# Patient Record
Sex: Female | Born: 1970 | Race: White | Hispanic: No | Marital: Married | Smoking: Former smoker
Health system: Southern US, Community
[De-identification: ages and names within clinical notes are randomized; demographics above are authoritative.]

## PROBLEM LIST (undated history)

## (undated) DIAGNOSIS — I10 Essential (primary) hypertension: Secondary | ICD-10-CM

## (undated) DIAGNOSIS — E0500 Thyrotoxicosis with diffuse goiter without thyrotoxic crisis or storm: Secondary | ICD-10-CM

## (undated) DIAGNOSIS — F419 Anxiety disorder, unspecified: Secondary | ICD-10-CM

## (undated) DIAGNOSIS — E119 Type 2 diabetes mellitus without complications: Secondary | ICD-10-CM

## (undated) DIAGNOSIS — K219 Gastro-esophageal reflux disease without esophagitis: Secondary | ICD-10-CM

---

## 2002-04-20 DIAGNOSIS — I471 Supraventricular tachycardia, unspecified: Secondary | ICD-10-CM

## 2002-04-20 HISTORY — DX: Supraventricular tachycardia, unspecified: I47.10

## 2009-02-07 ENCOUNTER — Ambulatory Visit: Payer: Self-pay | Admitting: Internal Medicine

## 2010-03-24 ENCOUNTER — Ambulatory Visit: Payer: Self-pay | Admitting: Pediatrics

## 2010-12-23 ENCOUNTER — Emergency Department: Payer: Self-pay | Admitting: Emergency Medicine

## 2012-06-05 ENCOUNTER — Ambulatory Visit: Payer: Self-pay | Admitting: Internal Medicine

## 2012-07-06 ENCOUNTER — Ambulatory Visit: Payer: Self-pay | Admitting: Family Medicine

## 2012-07-06 LAB — RAPID STREP-A WITH REFLX: Micro Text Report: NEGATIVE

## 2012-07-09 LAB — BETA STREP CULTURE(ARMC)

## 2012-08-17 ENCOUNTER — Ambulatory Visit: Payer: Self-pay | Admitting: Family Medicine

## 2014-04-28 ENCOUNTER — Ambulatory Visit: Payer: Self-pay | Admitting: Physician Assistant

## 2014-04-28 LAB — URINALYSIS, COMPLETE
Bilirubin,UR: NEGATIVE
Blood: NEGATIVE
Glucose,UR: NEGATIVE
KETONE: NEGATIVE
Leukocyte Esterase: NEGATIVE
Nitrite: NEGATIVE
Ph: 7 (ref 5.0–8.0)
Protein: NEGATIVE
Specific Gravity: 1.015 (ref 1.000–1.030)
WBC UR: NONE SEEN /HPF (ref 0–5)

## 2014-04-30 LAB — URINE CULTURE

## 2015-09-09 ENCOUNTER — Ambulatory Visit
Admission: EM | Admit: 2015-09-09 | Discharge: 2015-09-09 | Disposition: A | Discharge status: Home / Self Care | Payer: Managed Care, Other (non HMO) | Attending: Family Medicine | Admitting: Family Medicine

## 2015-09-09 ENCOUNTER — Encounter: Payer: Self-pay | Admitting: Emergency Medicine

## 2015-09-09 DIAGNOSIS — J0101 Acute recurrent maxillary sinusitis: Secondary | ICD-10-CM | POA: Diagnosis not present

## 2015-09-09 DIAGNOSIS — J301 Allergic rhinitis due to pollen: Secondary | ICD-10-CM | POA: Diagnosis not present

## 2015-09-09 DIAGNOSIS — H6593 Unspecified nonsuppurative otitis media, bilateral: Secondary | ICD-10-CM | POA: Diagnosis not present

## 2015-09-09 HISTORY — DX: Thyrotoxicosis with diffuse goiter without thyrotoxic crisis or storm: E05.00

## 2015-09-09 MED ORDER — AZITHROMYCIN 250 MG PO TABS: 250.0000 mg | ORAL_TABLET | Freq: Every day | ORAL | Status: DC

## 2015-09-09 MED ORDER — FLUTICASONE PROPIONATE 50 MCG/ACT NA SUSP: 1.0000 | Freq: Two times a day (BID) | NASAL | Status: DC

## 2015-09-09 MED ORDER — IBUPROFEN 800 MG PO TABS: 800.0000 mg | ORAL_TABLET | Freq: Three times a day (TID) | ORAL | Status: DC

## 2015-09-09 MED ORDER — SALINE SPRAY 0.65 % NA SOLN: 2.0000 | NASAL | Status: DC

## 2015-09-09 NOTE — ED Notes (Signed)
Patient c/o sinus pain and congestion and runny nose that started over the weekend.  Patient denies fevers.

## 2015-09-09 NOTE — ED Provider Notes (Signed)
CSN: [id number]     Arrival date & time 09/09/15  1141 History   First MD Initiated Contact with Patient 09/09/15 1318     Chief Complaint  Patient presents with  . Facial Pain  . Nasal Congestion   (Consider location/radiation/quality/duration/timing/severity/associated sxs/prior Treatment) HPI Comments: Married caucasian female here for evaluation sinus pain/congestion, rhinitis x 72 hours.  Seasonal allergies had seemed to be settling down thinks cold symptoms started this sinus infection.  Azithromycin has worked for patient in the past.  Currently not taking any allergy pills or sprays.  Flonase ok in the past.  Ears feel full denied discharge.  Hoarse voice.  Post nasal drip interrupting sleep.    PMHx graves disease, seasonal allergies, recurrent sinus infections, obesity  PSHx c-section x 2  FHx diabetes  The history is provided by the patient.    Past Medical History  Diagnosis Date  . Graves disease    Past Surgical History  Procedure Laterality Date  . Cesarean section     History reviewed. No pertinent family history. Social History  Substance Use Topics  . Smoking status: Never Smoker   . Smokeless tobacco: None  . Alcohol Use: Yes   OB History    No data available     Review of Systems  Constitutional: Positive for fatigue. Negative for fever, chills, diaphoresis, activity change, appetite change and unexpected weight change.  HENT: Positive for congestion, ear pain, postnasal drip, sinus pressure, sore throat and voice change. Negative for dental problem, drooling, ear discharge, facial swelling, hearing loss, mouth sores, nosebleeds, rhinorrhea, sneezing, tinnitus and trouble swallowing.   Eyes: Negative for photophobia, pain, discharge, redness, itching and visual disturbance.  Respiratory: Negative for cough, choking, chest tightness, shortness of breath, wheezing and stridor.   Cardiovascular: Negative for chest pain, palpitations and leg swelling.   Gastrointestinal: Negative for nausea, vomiting, abdominal pain, diarrhea, constipation, blood in stool and abdominal distention.  Endocrine: Negative for cold intolerance and heat intolerance.  Genitourinary: Negative for dysuria, hematuria and difficulty urinating.  Musculoskeletal: Negative for myalgias, back pain, joint swelling, arthralgias, gait problem, neck pain and neck stiffness.  Skin: Negative for color change, pallor, rash and wound.  Allergic/Immunologic: Positive for environmental allergies. Negative for food allergies.  Neurological: Negative for dizziness, tremors, seizures, syncope, facial asymmetry, speech difficulty, weakness, light-headedness, numbness and headaches.  Hematological: Negative for adenopathy. Does not bruise/bleed easily.  Psychiatric/Behavioral: Positive for sleep disturbance. Negative for behavioral problems, confusion and agitation.    Allergies  Amoxicillin; Doxycycline; Penicillins; and Sulfa antibiotics  Home Medications   Prior to Admission medications   Medication Sig Start Date End Date Taking? Authorizing Provider  methimazole (TAPAZOLE) 10 MG tablet Take 10 mg by mouth daily.   Yes Historical Provider, MD  propranolol (INDERAL) 10 MG tablet Take 10 mg by mouth as needed.   Yes Historical Provider, MD  azithromycin (ZITHROMAX) 250 MG tablet Take 1 tablet (250 mg total) by mouth daily. Take first 2 tablets together, then 1 every day until finished. 09/09/15   Barbaraann Barthel, NP  fluticasone (FLONASE) 50 MCG/ACT nasal spray Place 1 spray into both nostrils 2 (two) times daily. 09/09/15   Barbaraann Barthel, NP  ibuprofen (ADVIL,MOTRIN) 800 MG tablet Take 1 tablet (800 mg total) by mouth 3 (three) times daily. 09/09/15   Barbaraann Barthel, NP  sodium chloride (OCEAN) 0.65 % SOLN nasal spray Place 2 sprays into both nostrils every 2 (two) hours while awake. 09/09/15 10/10/15  Inetta Fermo  A Betancourt, NP   Meds Ordered and Administered this Visit   Medications - No data to display  BP 160/90 mmHg  Pulse 63  Temp(Src) 98.6 F (37 C) (Tympanic)  Resp 16  Ht  (1.549 m)  Wt 160 lb (72.576 kg)  BMI 30.25 kg/m2  SpO2 98%  LMP 08/19/2015 (Approximate) No data found.   Physical Exam  Constitutional: She is oriented to person, place, and time. She appears well-developed and well-nourished. She is active and cooperative.  Non-toxic appearance. She does not have a sickly appearance. She appears ill. No distress.  HENT:  Head: Normocephalic and atraumatic.  Right Ear: Hearing, external ear and ear canal normal. A middle ear effusion is present.  Left Ear: Hearing, external ear and ear canal normal. A middle ear effusion is present.  Nose: Mucosal edema and rhinorrhea present. No nose lacerations, sinus tenderness, nasal deformity, septal deviation or nasal septal hematoma. No epistaxis.  No foreign bodies. Right sinus exhibits maxillary sinus tenderness. Right sinus exhibits no frontal sinus tenderness. Left sinus exhibits maxillary sinus tenderness. Left sinus exhibits no frontal sinus tenderness.  Mouth/Throat: Uvula is midline and mucous membranes are normal. Mucous membranes are not pale, not dry and not cyanotic. She does not have dentures. No oral lesions. No trismus in the jaw. Normal dentition. No dental abscesses, uvula swelling, lacerations or dental caries. Posterior oropharyngeal edema and posterior oropharyngeal erythema present. No oropharyngeal exudate or tonsillar abscesses.  Cobblestoning posterior pharynx; bilateral nasal turbinates edema erythema clear discharge; bilateral TMs with air fluid level clear; bilateral allergic shiners  Eyes: Conjunctivae, EOM and lids are normal. Pupils are equal, round, and reactive to light. Right eye exhibits no chemosis, no discharge, no exudate and no hordeolum. No foreign body present in the right eye. Left eye exhibits no chemosis, no discharge, no exudate and no hordeolum. No foreign  body present in the left eye. Right conjunctiva is not injected. Right conjunctiva has no hemorrhage. Left conjunctiva is not injected. Left conjunctiva has no hemorrhage. No scleral icterus. Right eye exhibits normal extraocular motion and no nystagmus. Left eye exhibits normal extraocular motion and no nystagmus. Right pupil is round and reactive. Left pupil is round and reactive. Pupils are equal.  Neck: Trachea normal and normal range of motion. Neck supple. No tracheal tenderness, no spinous process tenderness and no muscular tenderness present. No rigidity. No tracheal deviation, no edema, no erythema and normal range of motion present. No thyroid mass and no thyromegaly present.  Cardiovascular: Normal rate, regular rhythm, S1 normal, S2 normal, normal heart sounds and intact distal pulses.  PMI is not displaced.  Exam reveals no gallop and no friction rub.   No murmur heard. Pulmonary/Chest: Effort normal and breath sounds normal. No accessory muscle usage or stridor. No respiratory distress. She has no decreased breath sounds. She has no wheezes. She has no rhonchi. She has no rales. She exhibits no tenderness.  Hoarse voice  Abdominal: Soft. She exhibits no distension.  Musculoskeletal: Normal range of motion. She exhibits no edema or tenderness.       Right shoulder: Normal.       Left shoulder: Normal.       Right hip: Normal.       Left hip: Normal.       Right knee: Normal.       Left knee: Normal.       Cervical back: Normal.       Right hand: Normal.  Left hand: Normal.  Lymphadenopathy:       Head (right side): No submental, no submandibular, no tonsillar, no preauricular, no posterior auricular and no occipital adenopathy present.       Head (left side): No submental, no submandibular, no tonsillar, no preauricular, no posterior auricular and no occipital adenopathy present.    She has no cervical adenopathy.       Right cervical: No superficial cervical, no deep cervical  and no posterior cervical adenopathy present.      Left cervical: No superficial cervical, no deep cervical and no posterior cervical adenopathy present.  Neurological: She is alert and oriented to person, place, and time. She has normal strength. She is not disoriented. She displays no atrophy and no tremor. No cranial nerve deficit or sensory deficit. She exhibits normal muscle tone. She displays no seizure activity. Coordination and gait normal. GCS eye subscore is 4. GCS verbal subscore is 5. GCS motor subscore is 6.  Skin: Skin is warm, dry and intact. No abrasion, no bruising, no burn, no ecchymosis, no laceration, no lesion, no petechiae and no rash noted. She is not diaphoretic. No cyanosis or erythema. No pallor. Nails show no clubbing.  Psychiatric: She has a normal mood and affect. Her speech is normal and behavior is normal. Judgment and thought content normal. Cognition and memory are normal.  Nursing note and vitals reviewed.   ED Course  Procedures (including critical care time)  Labs Review Labs Reviewed - No data to display  Imaging Review No results found.    MDM   1. Acute recurrent maxillary sinusitis   2. Otitis media with effusion, bilateral   3. Allergic rhinitis due to pollen    Patient may use normal saline nasal spray as needed.  flonase 1 spray each nostril BID.   Consider antihistamine OTC of choice po daily.  Avoid triggers if possible.  Shower prior to bedtime if exposed to triggers.  If allergic dust/dust mites recommend mattress/pillow covers/encasements; washing linens, vacuuming, sweeping, dusting weekly.  Call or return to clinic as needed if these symptoms worsen or fail to improve as anticipated.   Exitcare handout on allergic rhinitis given to patient.  Patient verbalized understanding of instructions, agreed with plan of care and had no further questions at this time.  P2:  Avoidance and hand washing.  Supportive treatment.   No evidence of invasive  bacterial infection, non toxic and well hydrated.  This is most likely self limiting viral infection.  I do not see where any further testing or imaging is necessary at this time.   I will suggest supportive care, rest, good hygiene and encourage the patient to take adequate fluids.  The patient is to return to clinic or EMERGENCY ROOM if symptoms worsen or change significantly e.g. ear pain, fever, purulent discharge from ears or bleeding.  Exitcare handout on otitis media with effusion given to patient.  Patient verbalized agreement and understanding of treatment plan.    Work excuse given for 24 hours.  Restart flonase 1 spray each nostril BID, saline 2 sprays each nostril q2h prn congestion.  If no improvement with 48 hours of saline and flonase use start azithromycin 500mg  po today then 250mg  po daily days 2 to 4 as allergic doxycycline, penicillin.  Motrin 800mg  po TID prn pain.  Rx given.  No evidence of systemic bacterial infection, non toxic and well hydrated.  I do not see where any further testing or imaging is necessary at this  time.   I will suggest supportive care, rest, good hygiene and encourage the patient to take adequate fluids.  The patient is to return to clinic or EMERGENCY ROOM if symptoms worsen or change significantly.  Exitcare handout on sinusitis given to patient.  Patient verbalized agreement and understanding of treatment plan and had no further questions at this time.   P2:  Hand washing and cover cough     Barbaraann Barthel, NP 09/09/15 1335

## 2015-09-09 NOTE — Discharge Instructions (Signed)
Allergic Rhinitis Allergic rhinitis is when the mucous membranes in the nose respond to allergens. Allergens are particles in the air that cause your body to have an allergic reaction. This causes you to release allergic antibodies. Through a chain of events, these eventually cause you to release histamine into the blood stream. Although meant to protect the body, it is this release of histamine that causes your discomfort, such as frequent sneezing, congestion, and an itchy, runny nose.  CAUSES Seasonal allergic rhinitis (hay fever) is caused by pollen allergens that may come from grasses, trees, and weeds. Year-round allergic rhinitis (perennial allergic rhinitis) is caused by allergens such as house dust mites, pet dander, and mold spores. SYMPTOMS  Nasal stuffiness (congestion).  Itchy, runny nose with sneezing and tearing of the eyes. DIAGNOSIS Your health care provider can help you determine the allergen or allergens that trigger your symptoms. If you and your health care provider are unable to determine the allergen, skin or blood testing may be used. Your health care provider will diagnose your condition after taking your health history and performing a physical exam. Your health care provider may assess you for other related conditions, such as asthma, pink eye, or an ear infection. TREATMENT Allergic rhinitis does not have a cure, but it can be controlled by:  Medicines that block allergy symptoms. These may include allergy shots, nasal sprays, and oral antihistamines.  Avoiding the allergen. Hay fever may often be treated with antihistamines in pill or nasal spray forms. Antihistamines block the effects of histamine. There are over-the-counter medicines that may help with nasal congestion and swelling around the eyes. Check with your health care provider before taking or giving this medicine. If avoiding the allergen or the medicine prescribed do not work, there are many new medicines  your health care provider can prescribe. Stronger medicine may be used if initial measures are ineffective. Desensitizing injections can be used if medicine and avoidance does not work. Desensitization is when a patient is given ongoing shots until the body becomes less sensitive to the allergen. Make sure you follow up with your health care provider if problems continue. HOME CARE INSTRUCTIONS It is not possible to completely avoid allergens, but you can reduce your symptoms by taking steps to limit your exposure to them. It helps to know exactly what you are allergic to so that you can avoid your specific triggers. SEEK MEDICAL CARE IF:  You have a fever.  You develop a cough that does not stop easily (persistent).  You have shortness of breath.  You start wheezing.  Symptoms interfere with normal daily activities.   This information is not intended to replace advice given to you by your health care provider. Make sure you discuss any questions you have with your health care provider.   Document Released: 12/30/2000 Document Revised: 04/27/2014 Document Reviewed: 12/12/2012 Elsevier Interactive Patient Education 2016 [employer] Sinusitis, Adult Sinusitis is redness, soreness, and inflammation of the paranasal sinuses. Paranasal sinuses are air pockets within the bones of your face. They are located beneath your eyes, in the middle of your forehead, and above your eyes. In healthy paranasal sinuses, mucus is able to drain out, and air is able to circulate through them by way of your nose. However, when your paranasal sinuses are inflamed, mucus and air can become trapped. This can allow bacteria and other germs to grow and cause infection. Sinusitis can develop quickly and last only a short time (acute) or continue over a long  period (chronic). Sinusitis that lasts for more than 12 weeks is considered chronic. CAUSES Causes of sinusitis include:  Allergies.  Structural abnormalities,  such as displacement of the cartilage that separates your nostrils (deviated septum), which can decrease the air flow through your nose and sinuses and affect sinus drainage.  Functional abnormalities, such as when the small hairs (cilia) that line your sinuses and help remove mucus do not work properly or are not present. SIGNS AND SYMPTOMS Symptoms of acute and chronic sinusitis are the same. The primary symptoms are pain and pressure around the affected sinuses. Other symptoms include:  Upper toothache.  Earache.  Headache.  Bad breath.  Decreased sense of smell and taste.  A cough, which worsens when you are lying flat.  Fatigue.  Fever.  Thick drainage from your nose, which often is green and may contain pus (purulent).  Swelling and warmth over the affected sinuses. DIAGNOSIS Your health care provider will perform a physical exam. During your exam, your health care provider may perform any of the following to help determine if you have acute sinusitis or chronic sinusitis:  Look in your nose for signs of abnormal growths in your nostrils (nasal polyps).  Tap over the affected sinus to check for signs of infection.  View the inside of your sinuses using an imaging device that has a light attached (endoscope). If your health care provider suspects that you have chronic sinusitis, one or more of the following tests may be recommended:  Allergy tests.  Nasal culture. A sample of mucus is taken from your nose, sent to a lab, and screened for bacteria.  Nasal cytology. A sample of mucus is taken from your nose and examined by your health care provider to determine if your sinusitis is related to an allergy. TREATMENT Most cases of acute sinusitis are related to a viral infection and will resolve on their own within 10 days. Sometimes, medicines are prescribed to help relieve symptoms of both acute and chronic sinusitis. These may include pain medicines, decongestants, nasal  steroid sprays, or saline sprays. However, for sinusitis related to a bacterial infection, your health care provider will prescribe antibiotic medicines. These are medicines that will help kill the bacteria causing the infection. Rarely, sinusitis is caused by a fungal infection. In these cases, your health care provider will prescribe antifungal medicine. For some cases of chronic sinusitis, surgery is needed. Generally, these are cases in which sinusitis recurs more than 3 times per year, despite other treatments. HOME CARE INSTRUCTIONS  Drink plenty of water. Water helps thin the mucus so your sinuses can drain more easily.  Use a humidifier.  Inhale steam 3-4 times a day (for example, sit in the bathroom with the shower running).  Apply a warm, moist washcloth to your face 3-4 times a day, or as directed by your health care provider.  Use saline nasal sprays to help moisten and clean your sinuses.  Take medicines only as directed by your health care provider.  If you were prescribed either an antibiotic or antifungal medicine, finish it all even if you start to feel better. SEEK IMMEDIATE MEDICAL CARE IF:  You have increasing pain or severe headaches.  You have nausea, vomiting, or drowsiness.  You have swelling around your face.  You have vision problems.  You have a stiff neck.  You have difficulty breathing.   This information is not intended to replace advice given to you by your health care provider. Make sure you discuss  any questions you have with your health care provider.   Document Released: 04/06/2005 Document Revised: 04/27/2014 Document Reviewed: 04/21/2011 Elsevier Interactive Patient Education 2016 [employer] Otitis Media With Effusion Otitis media with effusion is the presence of fluid in the middle ear. This is a common problem in children, which often follows ear infections. It may be present for weeks or longer after the infection. Unlike an acute ear  infection, otitis media with effusion refers only to fluid behind the ear drum and not infection. Children with repeated ear and sinus infections and allergy problems are the most likely to get otitis media with effusion. CAUSES  The most frequent cause of the fluid buildup is dysfunction of the eustachian tubes. These are the tubes that drain fluid in the ears to the back of the nose (nasopharynx). SYMPTOMS   The main symptom of this condition is hearing loss. As a result, you or your child may:  Listen to the TV at a loud volume.  Not respond to questions.  Ask "what" often when spoken to.  Mistake or confuse one sound or word for another.  There may be a sensation of fullness or pressure but usually not pain. DIAGNOSIS   Your health care provider will diagnose this condition by examining you or your child's ears.  Your health care provider may test the pressure in you or your child's ear with a tympanometer.  A hearing test may be conducted if the problem persists. TREATMENT   Treatment depends on the duration and the effects of the effusion.  Antibiotics, decongestants, nose drops, and cortisone-type drugs (tablets or nasal spray) may not be helpful.  Children with persistent ear effusions may have delayed language or behavioral problems. Children at risk for developmental delays in hearing, learning, and speech may require referral to a specialist earlier than children not at risk.  You or your child's health care provider may suggest a referral to an ear, nose, and throat surgeon for treatment. The following may help restore normal hearing:  Drainage of fluid.  Placement of ear tubes (tympanostomy tubes).  Removal of adenoids (adenoidectomy). HOME CARE INSTRUCTIONS   Avoid secondhand smoke.  Infants who are breastfed are less likely to have this condition.  Avoid feeding infants while they are lying flat.  Avoid known environmental allergens.  Avoid people who  are sick. SEEK MEDICAL CARE IF:   Hearing is not better in 3 months.  Hearing is worse.  Ear pain.  Drainage from the ear.  Dizziness. MAKE SURE YOU:   Understand these instructions.  Will watch your condition.  Will get help right away if you are not doing well or get worse.   This information is not intended to replace advice given to you by your health care provider. Make sure you discuss any questions you have with your health care provider.   Document Released: 05/14/2004 Document Revised: 04/27/2014 Document Reviewed: 11/01/2012 Elsevier Interactive Patient Education [employer]

## 2016-02-22 ENCOUNTER — Encounter: Payer: Self-pay | Admitting: Emergency Medicine

## 2016-02-22 ENCOUNTER — Ambulatory Visit
Admission: EM | Admit: 2016-02-22 | Discharge: 2016-02-22 | Disposition: A | Discharge status: Home / Self Care | Payer: Managed Care, Other (non HMO) | Attending: Family Medicine | Admitting: Family Medicine

## 2016-02-22 DIAGNOSIS — S29012A Strain of muscle and tendon of back wall of thorax, initial encounter: Secondary | ICD-10-CM

## 2016-02-22 MED ORDER — CYCLOBENZAPRINE HCL 10 MG PO TABS: 10.0000 mg | ORAL_TABLET | Freq: Three times a day (TID) | ORAL | 0 refills | Status: DC | PRN

## 2016-02-22 NOTE — ED Provider Notes (Signed)
[facility]    CSN: [id number] Arrival date & time: 02/22/16  [id number]     History   Chief Complaint Chief Complaint  Patient presents with  . Back Pain    HPI Abigail Mcneil is a 45 y.o. female.   [age] yo female with a c/o 3 days of right upper back and neck pain. Denies any injury, fevers, chills, chest pains, rash.    The history is provided by the patient.    Past Medical History:  Diagnosis Date  . Graves disease     There are no active problems to display for this patient.   Past Surgical History:  Procedure Laterality Date  . CESAREAN SECTION      OB History    No data available       Home Medications    Prior to Admission medications   Medication Sig Start Date End Date Taking? Authorizing Provider  escitalopram (LEXAPRO) 10 MG tablet Take 10 mg by mouth daily.   Yes Historical Provider, MD  cyclobenzaprine (FLEXERIL) 10 MG tablet Take 1 tablet (10 mg total) by mouth 3 (three) times daily as needed for muscle spasms. 02/22/16   Payton Mccallumrlando Pasquale Matters, MD  fluticasone (FLONASE) 50 MCG/ACT nasal spray Place 1 spray into both nostrils 2 (two) times daily. 09/09/15   Barbaraann Barthelina A Betancourt, NP  ibuprofen (ADVIL,MOTRIN) 800 MG tablet Take 1 tablet (800 mg total) by mouth 3 (three) times daily. 09/09/15   Barbaraann Barthelina A Betancourt, NP  methimazole (TAPAZOLE) 10 MG tablet Take 10 mg by mouth daily.    Historical Provider, MD  propranolol (INDERAL) 10 MG tablet Take 5 mg by mouth as needed.     Historical Provider, MD  sodium chloride (OCEAN) 0.65 % SOLN nasal spray Place 2 sprays into both nostrils every 2 (two) hours while awake. 09/09/15 10/10/15  Barbaraann Barthelina A Betancourt, NP    Family History History reviewed. No pertinent family history.  Social History Social History  Substance Use Topics  . Smoking status: Never Smoker  . Smokeless tobacco: Never Used  . Alcohol use Yes     Allergies   Amoxicillin; Doxycycline; Penicillins; and Sulfa antibiotics   Review of  Systems Review of Systems   Physical Exam Triage Vital Signs ED Triage Vitals  Enc Vitals Group     BP 02/22/16 0902 (S) (!) 163/99     Pulse Rate 02/22/16 0902 69     Resp 02/22/16 0902 16     Temp 02/22/16 0902 97 F (36.1 C)     Temp Source 02/22/16 0902 Tympanic     SpO2 02/22/16 0902 98 %     Weight 02/22/16 0901 190 lb (86.2 kg)     Height 02/22/16 0901 5\' 1"  (1.549 m)     Head Circumference --      Peak Flow --      Pain Score 02/22/16 0903 5     Pain Loc --      Pain Edu? --      Excl. in GC? --    No data found.   Updated Vital Signs BP (S) (!) 163/99 (BP Location: Left Arm)   Pulse 69   Temp 97 F (36.1 C) (Tympanic)   Resp 16   Ht 5\' 1"  (1.549 m)   Wt 190 lb (86.2 kg)   LMP 02/01/2016 (Approximate)   SpO2 98%   BMI 35.90 kg/m   Visual Acuity Right Eye Distance:   Left Eye Distance:   Bilateral  Distance:    Right Eye Near:   Left Eye Near:    Bilateral Near:     Physical Exam  Constitutional: She appears well-developed and well-nourished. No distress.  Musculoskeletal: She exhibits tenderness.       Cervical back: She exhibits tenderness and spasm.       Back:  Skin: She is not diaphoretic.  Nursing note and vitals reviewed.    UC Treatments / Results  Labs (all labs ordered are listed, but only abnormal results are displayed) Labs Reviewed - No data to display  EKG  EKG Interpretation None       Radiology No results found.  Procedures Procedures (including critical care time)  Medications Ordered in UC Medications - No data to display   Initial Impression / Assessment and Plan / UC Course  I have reviewed the triage vital signs and the nursing notes.  Pertinent labs & imaging results that were available during my care of the patient were reviewed by me and considered in my medical decision making (see chart for details).  Clinical Course      Final Clinical Impressions(s) / UC Diagnoses   Final diagnoses:  Upper  back strain, initial encounter    New Prescriptions Discharge Medication List as of 02/22/2016  9:22 AM    START taking these medications   Details  cyclobenzaprine (FLEXERIL) 10 MG tablet Take 1 tablet (10 mg total) by mouth 3 (three) times daily as needed for muscle spasms., Starting Sat 02/22/2016, Normal       1.  diagnosis reviewed with patient 2. rx as per orders above; reviewed possible side effects, interactions, risks and benefits  3. Recommend supportive treatment with heat, stretching 4. Follow-up prn if symptoms worsen or don't improve   Payton Mccallumrlando Jakari Jacot, MD 02/22/16 [phone]

## 2016-02-22 NOTE — ED Triage Notes (Signed)
Patient c/o upper back pain that radiates up her neck on the left side for the past 3 days.  Patient denies N/V.  Patient denies fevers.

## 2016-06-15 ENCOUNTER — Emergency Department: Payer: Managed Care, Other (non HMO)

## 2016-06-15 ENCOUNTER — Emergency Department
Admission: EM | Admit: 2016-06-15 | Discharge: 2016-06-15 | Disposition: A | Discharge status: Home / Self Care | Payer: Managed Care, Other (non HMO) | Attending: Emergency Medicine | Admitting: Emergency Medicine

## 2016-06-15 DIAGNOSIS — R0789 Other chest pain: Secondary | ICD-10-CM | POA: Insufficient documentation

## 2016-06-15 DIAGNOSIS — Z79899 Other long term (current) drug therapy: Secondary | ICD-10-CM | POA: Diagnosis not present

## 2016-06-15 DIAGNOSIS — R1013 Epigastric pain: Secondary | ICD-10-CM | POA: Insufficient documentation

## 2016-06-15 DIAGNOSIS — I10 Essential (primary) hypertension: Secondary | ICD-10-CM | POA: Insufficient documentation

## 2016-06-15 DIAGNOSIS — R109 Unspecified abdominal pain: Secondary | ICD-10-CM

## 2016-06-15 HISTORY — DX: Essential (primary) hypertension: I10

## 2016-06-15 LAB — BASIC METABOLIC PANEL
Anion gap: 10 (ref 5–15)
BUN: 20 mg/dL (ref 6–20)
CHLORIDE: 95 mmol/L — AB (ref 101–111)
CO2: 31 mmol/L (ref 22–32)
CREATININE: 0.8 mg/dL (ref 0.44–1.00)
Calcium: 9.2 mg/dL (ref 8.9–10.3)
GFR calc Af Amer: >60 mL/min (ref >60)
GFR calc non Af Amer: >60 mL/min (ref >60)
Glucose, Bld: 115 mg/dL — ABNORMAL HIGH (ref 65–99)
Potassium: 3.2 mmol/L — ABNORMAL LOW (ref 3.5–5.1)
Sodium: 136 mmol/L (ref 135–145)

## 2016-06-15 LAB — CBC
HCT: 45.1 % (ref 35.0–47.0)
Hemoglobin: 16.2 g/dL — ABNORMAL HIGH (ref 12.0–16.0)
MCH: 32.9 pg (ref 26.0–34.0)
MCHC: 35.9 g/dL (ref 32.0–36.0)
MCV: 91.7 fL (ref 80.0–100.0)
PLATELETS: 202 10*3/uL (ref 150–440)
RBC: 4.92 MIL/uL (ref 3.80–5.20)
RDW: 13.4 % (ref 11.5–14.5)
WBC: 7.7 10*3/uL (ref 3.6–11.0)

## 2016-06-15 LAB — URINALYSIS, COMPLETE (UACMP) WITH MICROSCOPIC
Bilirubin Urine: NEGATIVE
GLUCOSE, UA: NEGATIVE mg/dL
HGB URINE DIPSTICK: NEGATIVE
Ketones, ur: NEGATIVE mg/dL
Leukocytes, UA: NEGATIVE
Nitrite: NEGATIVE
Protein, ur: NEGATIVE mg/dL
SPECIFIC GRAVITY, URINE: 1.004 — AB (ref 1.005–1.030)
pH: 8 (ref 5.0–8.0)

## 2016-06-15 LAB — HEPATIC FUNCTION PANEL
ALT: 70 U/L — ABNORMAL HIGH (ref 14–54)
AST: 37 U/L (ref 15–41)
Albumin: 4.7 g/dL (ref 3.5–5.0)
Alkaline Phosphatase: 66 U/L (ref 38–126)
BILIRUBIN TOTAL: 0.6 mg/dL (ref 0.3–1.2)
Total Protein: 7.5 g/dL (ref 6.5–8.1)

## 2016-06-15 LAB — LIPASE, BLOOD: LIPASE: 16 U/L (ref 11–51)

## 2016-06-15 LAB — POCT PREGNANCY, URINE: PREG TEST UR: NEGATIVE

## 2016-06-15 LAB — TROPONIN I
Troponin I: <0.03 ng/mL (ref <0.03)
Troponin I: <0.03 ng/mL (ref <0.03)

## 2016-06-15 MED ORDER — POTASSIUM CHLORIDE CRYS ER 20 MEQ PO TBCR
40.0000 meq | EXTENDED_RELEASE_TABLET | Freq: Once | ORAL | Status: AC
  Administered 2016-06-15: 40 meq via ORAL

## 2016-06-15 MED ORDER — IOPAMIDOL (ISOVUE-370) INJECTION 76%
100.0000 mL | Freq: Once | INTRAVENOUS | Status: AC | PRN
Start: 2016-06-15 — End: 2016-06-15
  Administered 2016-06-15: 100 mL via INTRAVENOUS
  Filled 2016-06-15: qty 100

## 2016-06-15 MED ORDER — MORPHINE SULFATE (PF) 4 MG/ML IV SOLN
INTRAVENOUS | Status: AC
  Filled 2016-06-15: qty 1

## 2016-06-15 MED ORDER — FENTANYL CITRATE (PF) 100 MCG/2ML IJ SOLN: 50.0000 ug | Freq: Once | INTRAMUSCULAR | Status: DC

## 2016-06-15 MED ORDER — IOPAMIDOL (ISOVUE-300) INJECTION 61%
100.0000 mL | Freq: Once | INTRAVENOUS | Status: DC | PRN
  Filled 2016-06-15: qty 100

## 2016-06-15 MED ORDER — POTASSIUM CHLORIDE CRYS ER 20 MEQ PO TBCR
EXTENDED_RELEASE_TABLET | ORAL | Status: AC
  Filled 2016-06-15: qty 2

## 2016-06-15 MED ORDER — FAMOTIDINE 20 MG PO TABS: 20.0000 mg | ORAL_TABLET | Freq: Every day | ORAL | 0 refills | Status: DC

## 2016-06-15 MED ORDER — KETOROLAC TROMETHAMINE 30 MG/ML IJ SOLN: 15.0000 mg | Freq: Once | INTRAMUSCULAR | Status: DC

## 2016-06-15 MED ORDER — MORPHINE SULFATE (PF) 4 MG/ML IV SOLN
4.0000 mg | Freq: Once | INTRAVENOUS | Status: AC
  Administered 2016-06-15: 4 mg via INTRAVENOUS

## 2016-06-15 NOTE — ED Provider Notes (Addendum)
[facility] Emergency Department Provider Note  ____________________________________________   I have reviewed the triage vital signs and the nursing notes.   HISTORY  Chief Complaint Chest Pain    HPI Abigail Mcneil is a 46 y.o. female history of hypertension baseline blood pressures around 180, recently diagnosed. Started on medication over the weekend for hypertension. Was cutting vegetables and noticed a right-sided and epigastric abdominal discomfort which felt like "gas". It radiated up her esophagus and towards her back. She has never had aching to this extent before although she has had some slight discomfort. This was at rest. Nonexertional. No shortness of breath, nonpleuritic, no personal or family history of PE DVT, no recent travel, not on any birth control. Does not feel that she is pregnant.Pain is much better at this time. She still has some pain in her back. She does not have any numbness or weakness. Physical crampy discomfort.   Past Medical History:  Diagnosis Date  . Graves disease   . Hypertension     There are no active problems to display for this patient.   Past Surgical History:  Procedure Laterality Date  . CESAREAN SECTION      Prior to Admission medications   Medication Sig Start Date End Date Taking? Authorizing Provider  cyclobenzaprine (FLEXERIL) 10 MG tablet Take 1 tablet (10 mg total) by mouth 3 (three) times daily as needed for muscle spasms. 02/22/16   Payton Mccallum, MD  escitalopram (LEXAPRO) 10 MG tablet Take 10 mg by mouth daily.    Historical Provider, MD  fluticasone (FLONASE) 50 MCG/ACT nasal spray Place 1 spray into both nostrils 2 (two) times daily. 09/09/15   Barbaraann Barthel, NP  ibuprofen (ADVIL,MOTRIN) 800 MG tablet Take 1 tablet (800 mg total) by mouth 3 (three) times daily. 09/09/15   Barbaraann Barthel, NP  methimazole (TAPAZOLE) 10 MG tablet Take 10 mg by mouth daily.    Historical Provider, MD   propranolol (INDERAL) 10 MG tablet Take 5 mg by mouth as needed.     Historical Provider, MD  sodium chloride (OCEAN) 0.65 % SOLN nasal spray Place 2 sprays into both nostrils every 2 (two) hours while awake. 09/09/15 10/10/15  Barbaraann Barthel, NP    Allergies Amoxicillin; Doxycycline; Penicillins; and Sulfa antibiotics  No family history on file.  Social History Social History  Substance Use Topics  . Smoking status: Never Smoker  . Smokeless tobacco: Never Used  . Alcohol use Yes    Review of Systems Constitutional: No fever/chills Eyes: No visual changes. ENT: No sore throat. No stiff neck no neck pain Cardiovascular: See history of present illness. Respiratory: Denies shortness of breath. Gastrointestinal:   no vomiting.  No diarrhea.  No constipation. Genitourinary: Negative for dysuria. Musculoskeletal: Negative lower extremity swelling Skin: Negative for rash. Neurological: Negative for severe headaches, focal weakness or numbness. 10-point ROS otherwise negative.  ____________________________________________   PHYSICAL EXAM:  VITAL SIGNS: ED Triage Vitals  Enc Vitals Group     BP 06/15/16 1244 (!) 180/101     Pulse Rate 06/15/16 1244 66     Resp 06/15/16 1244 18     Temp 06/15/16 1244 98.2 F (36.8 C)     Temp Source 06/15/16 1244 Oral     SpO2 06/15/16 1244 96 %     Weight 06/15/16 1244 190 lb (86.2 kg)     Height 06/15/16 1244 5\' 1"  (1.549 m)     Head Circumference --  Peak Flow --      Pain Score 06/15/16 1252 5     Pain Loc --      Pain Edu? --      Excl. in GC? --     Constitutional: Alert and oriented. Well appearing and in no acute distress. Eyes: Conjunctivae are normal. PERRL. EOMI. Head: Atraumatic. Nose: No congestion/rhinnorhea. Mouth/Throat: Mucous membranes are moist.  Oropharynx non-erythematous. Neck: No stridor.   Nontender with no meningismus Cardiovascular: Normal rate, regular rhythm. Grossly normal heart sounds.  Good  peripheral circulation. Respiratory: Normal respiratory effort.  No retractions. Lungs CTAB. Abdominal: Soft and Positive tenderness to palpation in the epigastric as well as right upper quadrant which reproduces and worsens her pain while or pulsatile mass noted. No distention. No guarding no rebound Back:  There is no focal tenderness or step off.  there is no midline tenderness there are no lesions noted. there is no CVA tenderness Musculoskeletal: No lower extremity tenderness, no upper extremity tenderness. No joint effusions, no DVT signs strong distal pulses no edema Neurologic:  Normal speech and language. No gross focal neurologic deficits are appreciated.  Skin:  Skin is warm, dry and intact. No rash noted. Psychiatric: Mood and affect are normal. Speech and behavior are normal.  ____________________________________________   LABS (all labs ordered are listed, but only abnormal results are displayed)  Labs Reviewed  BASIC METABOLIC PANEL - Abnormal; Notable for the following:       Result Value   Potassium 3.2 (*)    Chloride 95 (*)    Glucose, Bld 115 (*)    All other components within normal limits  CBC - Abnormal; Notable for the following:    Hemoglobin 16.2 (*)    All other components within normal limits  TROPONIN I  HEPATIC FUNCTION PANEL  LIPASE, BLOOD  URINALYSIS, COMPLETE (UACMP) WITH MICROSCOPIC  POC URINE PREG, ED   ____________________________________________  EKG  I personally interpreted any EKGs ordered by me or triage Sinus rhythm at 63 bpm no acute ST elevation or acute ST depression, nonspecific ST changes noted. No acute ischemia ____________________________________________  RADIOLOGY  I reviewed any imaging ordered by me or triage that were performed during my shift and, if possible, patient and/or family made aware of any abnormal findings. ____________________________________________   PROCEDURES  Procedure(s) performed:  None  Procedures  Critical Care performed: None  ____________________________________________   INITIAL IMPRESSION / ASSESSMENT AND PLAN / ED COURSE  Pertinent labs & imaging results that were available during my care of the patient were reviewed by me and considered in my medical decision making (see chart for details).  Patient here with reproducible epigastric and right upper quadrant abdominal pain. Troponin is negative. Pain started approximately noon. May need a repeat troponin but at this time low suspicion for ACS. Similarly low suspicion for PE or dissection. Patient's blood pressure somewhat elevated, but this is not unusual for her, and she was anxious upon arrival. I expect this will come down. We will give her narcotic pain medication, I will not on liver function tests and lipase to her workup for what is clearly at this time a reproducible epigastric/right upper quadrant discomfort, we will obtain ultrasound of that area chest x-ray and reassess. Patient is very comfortable at this time.  ----------------------------------------- 2:09 PM on 06/15/2016 -----------------------------------------  She states she is pain-free at this time, she is receiving ultrasound, thus far workup is quite reassuring.  ----------------------------------------- 4:42 PM on 06/15/2016 -----------------------------------------  She  remains pain-free, blood pressure has come down as patient has relaxed in the department, as anticipated, she has a slight bump in one of her transaminases but the rest of her workup including CT is very reassuring. Serial cardiac enzymes are negative. The patient is in no Distress. She was having epigastric pain which radiated all different directions. Because of her elevated blood pressure, which was not much elevated for her, I did do a CT scan to rule out dissection which is negative. This is also not unexpected but certainly a reassuring event. Patient is eager to  go home. We will advise antiacids, follow-up with her primary care doctor tomorrow and return precautions for new or worrisome symptoms including recurrence of pain. Patient very comfortable with this plan and eager to go home.   ____________________________________________   FINAL CLINICAL IMPRESSION(S) / ED DIAGNOSES  Final diagnoses:  Abdominal pain      This chart was dictated using voice recognition software.  Despite best efforts to proofread,  errors can occur which can change meaning.      Jeanmarie Plant, MD 06/15/16 1348    Jeanmarie Plant, MD 06/15/16 1409    Jeanmarie Plant, MD 06/15/16 [phone]

## 2016-06-15 NOTE — Discharge Instructions (Signed)
Continue taking blood pressure medication as prescribed. If you have chest pain shortness of breath, or any other new or worrisome symptoms including abdominal pain, vomiting, black stool or you feel worse in any way, return to the emergency department. Follow closely with primary care doctor tomorrow as discussed.

## 2016-06-15 NOTE — ED Triage Notes (Signed)
Pt c/o chest pain that radiates into the back and left side of jaw for the past couple of hours with sweats, denies SOB or nausea. States recently started on b/p medication

## 2016-08-20 ENCOUNTER — Ambulatory Visit
Admission: EM | Admit: 2016-08-20 | Discharge: 2016-08-20 | Disposition: A | Discharge status: Home / Self Care | Payer: Managed Care, Other (non HMO) | Attending: Family Medicine | Admitting: Family Medicine

## 2016-08-20 DIAGNOSIS — J40 Bronchitis, not specified as acute or chronic: Secondary | ICD-10-CM

## 2016-08-20 DIAGNOSIS — J069 Acute upper respiratory infection, unspecified: Secondary | ICD-10-CM

## 2016-08-20 MED ORDER — HYDROCOD POLST-CPM POLST ER 10-8 MG/5ML PO SUER: 5.0000 mL | Freq: Two times a day (BID) | ORAL | 0 refills | Status: DC

## 2016-08-20 MED ORDER — BENZONATATE 200 MG PO CAPS: ORAL_CAPSULE | ORAL | 0 refills | Status: DC

## 2016-08-20 MED ORDER — AZITHROMYCIN 250 MG PO TABS: ORAL_TABLET | ORAL | 0 refills | Status: DC

## 2016-08-20 NOTE — ED Triage Notes (Signed)
3 weeks of productive cough, diarrhea x one episode on Tuesday. 2 days of intermittent fevers. Pain 5/10 generalized pain.

## 2016-08-20 NOTE — ED Provider Notes (Signed)
CSN: [id number]     Arrival date & time 08/20/16  1191 History   First MD Initiated Contact with Patient 08/20/16 1031     Chief Complaint  Patient presents with  . Cough  . Fever   (Consider location/radiation/quality/duration/timing/severity/associated sxs/prior Treatment) HPI This a 46 year old female who presents with 3 weeks of a productive fever diarrhea that she had on Tuesday but has since stopped. 2 days of intermittent fevers reaching 101. Currently she is 98.9. She has generalized pain. Patient does not appear ill or toxic.       Past Medical History:  Diagnosis Date  . Graves disease   . Hypertension    Past Surgical History:  Procedure Laterality Date  . CESAREAN SECTION     History reviewed. No pertinent family history. Social History  Substance Use Topics  . Smoking status: Never Smoker  . Smokeless tobacco: Never Used  . Alcohol use Yes     Comment: social   OB History    No data available     Review of Systems  Constitutional: Positive for activity change, fatigue and fever. Negative for appetite change and chills.  HENT: Positive for sore throat.   Respiratory: Positive for cough. Negative for wheezing and stridor.   All other systems reviewed and are negative.   Allergies  Amoxicillin; Doxycycline; Penicillins; and Sulfa antibiotics  Home Medications   Prior to Admission medications   Medication Sig Start Date End Date Taking? Authorizing Provider  chlorthalidone (HYGROTON) 25 MG tablet Take 25 mg by mouth daily.   Yes Historical Provider, MD  azithromycin (ZITHROMAX Z-PAK) 250 MG tablet Use as per package instructions 08/20/16   Lutricia Feil, PA-C  benzonatate (TESSALON) 200 MG capsule Take one cap TID PRN cough 08/20/16   Lutricia Feil, PA-C  chlorpheniramine-HYDROcodone (TUSSIONEX PENNKINETIC ER) 10-8 MG/5ML SUER Take 5 mLs by mouth 2 (two) times daily. 08/20/16   Lutricia Feil, PA-C  escitalopram (LEXAPRO) 10 MG tablet Take 10 mg by  mouth daily.    Historical Provider, MD  fluticasone (FLONASE) 50 MCG/ACT nasal spray Place 1 spray into both nostrils 2 (two) times daily. 09/09/15   Barbaraann Barthel, NP  ibuprofen (ADVIL,MOTRIN) 800 MG tablet Take 1 tablet (800 mg total) by mouth 3 (three) times daily. 09/09/15   Barbaraann Barthel, NP  methimazole (TAPAZOLE) 10 MG tablet Take 10 mg by mouth daily.    Historical Provider, MD  propranolol (INDERAL) 10 MG tablet Take 5 mg by mouth as needed.     Historical Provider, MD  sodium chloride (OCEAN) 0.65 % SOLN nasal spray Place 2 sprays into both nostrils every 2 (two) hours while awake. 09/09/15 10/10/15  Barbaraann Barthel, NP   Meds Ordered and Administered this Visit  Medications - No data to display  BP (!) 140/99 (BP Location: Left Arm)   Pulse 75   Temp 98.9 F (37.2 C) (Oral)   Resp 16   Ht 5\' 1"  (1.549 m)   Wt 180 lb (81.6 kg)   LMP 08/06/2016   SpO2 98%   BMI 34.01 kg/m  No data found.   Physical Exam  Constitutional: She is oriented to person, place, and time. She appears well-developed and well-nourished. No distress.  HENT:  Head: Normocephalic and atraumatic.  Right Ear: External ear normal.  Left Ear: External ear normal.  Nose: Nose normal.  Mouth/Throat: Oropharynx is clear and moist. No oropharyngeal exudate.  Eyes: EOM are normal. Pupils are equal, round,  and reactive to light. Right eye exhibits no discharge. Left eye exhibits no discharge.  Neck: Normal range of motion. Neck supple.  Pulmonary/Chest: Effort normal and breath sounds normal. No respiratory distress. She has no wheezes. She has no rales.  Musculoskeletal: Normal range of motion.  Lymphadenopathy:    She has no cervical adenopathy.  Neurological: She is alert and oriented to person, place, and time.  Skin: Skin is warm and dry. She is not diaphoretic.  Psychiatric: She has a normal mood and affect. Her behavior is normal. Judgment and thought content normal.  Nursing note and vitals  reviewed.   Urgent Care Course     Procedures (including critical care time)  Labs Review Labs Reviewed - No data to display  Imaging Review No results found.   Visual Acuity Review  Right Eye Distance:   Left Eye Distance:   Bilateral Distance:    Right Eye Near:   Left Eye Near:    Bilateral Near:         MDM   1. Upper respiratory tract infection, unspecified type   2. Bronchitis    Discharge Medication List as of 08/20/2016 10:47 AM    START taking these medications   Details  azithromycin (ZITHROMAX Z-PAK) 250 MG tablet Use as per package instructions, Normal    benzonatate (TESSALON) 200 MG capsule Take one cap TID PRN cough, Normal    chlorpheniramine-HYDROcodone (TUSSIONEX PENNKINETIC ER) 10-8 MG/5ML SUER Take 5 mLs by mouth 2 (two) times daily., Starting Thu 08/20/2016, Print      Plan: 1. Test/x-ray results and diagnosis reviewed with patient 2. rx as per orders; risks, benefits, potential side effects reviewed with patient 3. Recommend supportive treatment with Rest and fluids. I will treat her conservatively since her exam is relatively benign. We will give her a course of azithromycin she's had this for 3 weeks as well as treating the cough. If she continues to have a cough after 2 more weeks that she should return for consideration of a chest x-ray. Otherwise if she does well should be self-limiting. She has a follow-up with her primary care physician [facility] [place] [facility] 4. F/u prn if symptoms worsen or don't improve     Lutricia FeilWilliam P Roemer, PA-C 08/20/16 1059

## 2017-05-22 ENCOUNTER — Ambulatory Visit
Admission: EM | Admit: 2017-05-22 | Discharge: 2017-05-22 | Disposition: A | Discharge status: Home / Self Care | Payer: Managed Care, Other (non HMO) | Attending: Emergency Medicine | Admitting: Emergency Medicine

## 2017-05-22 ENCOUNTER — Encounter: Payer: Self-pay | Admitting: Gynecology

## 2017-05-22 ENCOUNTER — Other Ambulatory Visit: Payer: Self-pay

## 2017-05-22 DIAGNOSIS — J0100 Acute maxillary sinusitis, unspecified: Secondary | ICD-10-CM

## 2017-05-22 HISTORY — DX: Gastro-esophageal reflux disease without esophagitis: K21.9

## 2017-05-22 MED ORDER — FLUTICASONE PROPIONATE 50 MCG/ACT NA SUSP: 2.0000 | Freq: Every day | NASAL | 0 refills | Status: DC

## 2017-05-22 MED ORDER — LEVOFLOXACIN 500 MG PO TABS: 500.0000 mg | ORAL_TABLET | Freq: Every day | ORAL | 0 refills | Status: DC

## 2017-05-22 NOTE — Discharge Instructions (Signed)
Take the medication as written. Start either Claritin D, or Allegra-D, or Zyrtec-D to keep the mucous thin and to decongest you.  Keep an eye onyour blood pressure.  If this raises your blood pressure, then switch to plain Claritin, Allegra or Zyrtec.  Return to the ER if you get worse, have a fever >100.4, or for any concerns. You may take 600 mg of motrin with 1 gram of tylenol up to 3-4 times a day as needed for pain. This is an effective combination for pain. Use a NeilMed sinus rinse as often as you want to to reduce nasal congestion. Follow the directions on the box.  Here is a list of primary care providers who are taking new patients:  Dr. Elizabeth Sauereanna Jones, Dr. Schuyler AmorWilliam Plonk [address] [place] [address] [phone]  [facility] [place] [address]  [place] [address]  [phone]  [facility] [address]  [place], [place] [address] [phone]  [facility] [address]  [phone] [place], [place] [address]  Here are clinics/ other resources who will see you if you do not have insurance. Some have certain criteria that you must meet. Call them and find out what they are:  [facility]: [address] [place], [place] [address] Phone: [phone] Hours: First and Third Saturdays of each Month, 9 a.m. - 1 p.m.  [facility]: [address], [place], [place] [address] Phone: [phone] Hours: Tuesday, 4 p.m. - 8 p.m. Thursday, 1 p.m. - 8 p.m. Wednesday, 9 a.m. - [facility]  [facility] [address], [place], [place] [address] Phone: [phone] Pharmacy Phone Number: [phone] Dental Phone Number: [phone] [facility] Insurance Help: [phone]  Dental Hours: Monday - Thursday, 8 a.m. - 6 p.m.  [facility] [address] [place], [place] [address] Phone: [phone] Pharmacy Phone Number: [phone] [facility] Insurance Help: [phone]  [facility] [address] [place], [place] [address] Phone: [phone] Pharmacy Phone Number: [phone] [facility] Insurance Help: [phone]  [facility] [address] [place], [place] [address] Phone: [phone] [address] [facility] Insurance Help: [phone]   [facility] [address] [place], [place] [address] Phone: [phone]   Go to www.goodrx.com to look up your medications. This will give you a list of where you can find your prescriptions at the most affordable prices. Or you can ask the pharmacist what the cash price is. This is frequently cheaper than going through insurance.

## 2017-05-22 NOTE — ED Provider Notes (Signed)
HPI  SUBJECTIVE:  Abigail Mcneil is a 47 y.o. female who presents with light yellow nasal congestion, rhinorrhea, postnasal drip, bilateral ear popping and muffled hearing.  She reports left-sided maxillary intermittent sinus and upper dental pain.  Reports an occasional cough productive of the same material as her nasal congestion.  Reports a sore throat at night because she cannot breathe through her nose and states that she is occasionally waking up at night coughing.  She reports allergy symptoms with itchy, watery eyes and sneezing.  States that she did get better and then got worse again.  No fevers, wheezing, chest pain, shortness of breath.  No antipyretic in the past 6-8 hours.  No antibiotics in the past month.  She tried hydrogen peroxide in her ears, Benadryl, Advil, cold allergy medicine.  She has not taken any other antihistamines.  She is also tried honey tea, lemon, apple cider vinegar.  States that the Benadryl seems to dry her up.  No aggravating factors.  She has a past medical history of allergies in the fall, gestational diabetes, hypertension, sinusitis.  States this feels like her previous episodes of sinusitis.  No history of asthma.  LMP: This week.  Denies possibility being pregnant.  PMD: None.    Past Medical History:  Diagnosis Date  . GERD (gastroesophageal reflux disease)   . Graves disease   . Hypertension     Past Surgical History:  Procedure Laterality Date  . CESAREAN SECTION      Family History  Problem Relation Age of Onset  . Diabetes Father   . Hypertension Father     Social History   Tobacco Use  . Smoking status: Never Smoker  . Smokeless tobacco: Never Used  Substance Use Topics  . Alcohol use: Yes    Comment: social  . Drug use: No    No current facility-administered medications for this encounter.   Current Outpatient Medications:  .  chlorthalidone (HYGROTON) 25 MG tablet, Take 25 mg by mouth daily., Disp: , Rfl:  .   methimazole (TAPAZOLE) 10 MG tablet, Take 10 mg by mouth daily., Disp: , Rfl:  .  omeprazole (PRILOSEC) 20 MG capsule, Take 20 mg by mouth daily., Disp: , Rfl:  .  propranolol (INDERAL) 10 MG tablet, Take 5 mg by mouth as needed. , Disp: , Rfl:  .  escitalopram (LEXAPRO) 10 MG tablet, Take 10 mg by mouth daily., Disp: , Rfl:  .  fluticasone (FLONASE) 50 MCG/ACT nasal spray, Place 2 sprays into both nostrils daily., Disp: 16 g, Rfl: 0 .  levofloxacin (LEVAQUIN) 500 MG tablet, Take 1 tablet (500 mg total) by mouth daily. X 7 days, Disp: 7 tablet, Rfl: 0 .  sodium chloride (OCEAN) 0.65 % SOLN nasal spray, Place 2 sprays into both nostrils every 2 (two) hours while awake., Disp: , Rfl: 0  Allergies  Allergen Reactions  . Amoxicillin Diarrhea  . Doxycycline Rash  . Penicillins Rash  . Sulfa Antibiotics Rash     ROS  As noted in HPI.   Physical Exam  BP (!) 141/97 (BP Location: Left Arm)   Pulse 68   Temp 98.4 F (36.9 C) (Oral)   Resp 16   Ht 5' (1.524 m)   Wt 175 lb (79.4 kg)   LMP 05/16/2017   SpO2 97%   BMI 34.18 kg/m   Constitutional: Well developed, well nourished, no acute distress Eyes:  EOMI, conjunctiva normal bilaterally HENT: Normocephalic, atraumatic,mucus membranes moist.  TMs normal  bilaterally.  Erythematous, swollen turbinates.  Positive mucoid nasal congestion.  Positive left maxillary sinus tenderness.  Positive cobblestoning.  No postnasal drip. Respiratory: Normal inspiratory effort lungs clear bilaterally Cardiovascular: Normal rate no murmurs, rubs, gallops GI: nondistended skin: No rash, skin intact Musculoskeletal: no deformities Neurologic: Alert & oriented x 3, no focal neuro deficits Psychiatric: Speech and behavior appropriate   ED Course   Medications - No data to display  No orders of the defined types were placed in this encounter.   No results found for this or any previous visit (from the past 24 hour(s)). No results found.  ED  Clinical Impression  Acute non-recurrent maxillary sinusitis   ED Assessment/Plan  Patient with a sinusitis, most likely allergy mediated.  She does have history of double sickening and given the duration of symptoms, feel that antibiotics would be appropriate.  She reports a rash to doxycycline, penicillin and amoxicillin.  So we will send home with Levaquin 750 mg daily for 7 days, saline nasal irrigation, Flonase, advised and histamine/decongestant combination such as Claritin, Allegra or Zyrtec- D.  Providing a primary care referral list for routine care.  May also return here if not better in a week.  She will go to the ER if she gets worse.   Discussed MDM, plan and followup with patient. Discussed sn/sx that should prompt return to the ED. patient agrees with plan.   Meds ordered this encounter  Medications  . levofloxacin (LEVAQUIN) 500 MG tablet    Sig: Take 1 tablet (500 mg total) by mouth daily. X 7 days    Dispense:  7 tablet    Refill:  0  . fluticasone (FLONASE) 50 MCG/ACT nasal spray    Sig: Place 2 sprays into both nostrils daily.    Dispense:  16 g    Refill:  0    *This clinic note was created using Scientist, clinical (histocompatibility and immunogenetics). Therefore, there may be occasional mistakes despite careful proofreading.   ?   Domenick Gong, MD 05/22/17 [phone]

## 2017-05-22 NOTE — ED Triage Notes (Signed)
Patient c/o upper respiratory infection x 1 month.

## 2017-12-31 ENCOUNTER — Emergency Department
Admission: EM | Admit: 2017-12-31 | Discharge: 2017-12-31 | Disposition: A | Discharge status: Home / Self Care | Payer: Worker's Compensation | Attending: Emergency Medicine | Admitting: Emergency Medicine

## 2017-12-31 ENCOUNTER — Encounter: Payer: Self-pay | Admitting: Emergency Medicine

## 2017-12-31 ENCOUNTER — Emergency Department: Payer: Worker's Compensation

## 2017-12-31 ENCOUNTER — Other Ambulatory Visit: Payer: Self-pay

## 2017-12-31 DIAGNOSIS — Y9389 Activity, other specified: Secondary | ICD-10-CM | POA: Diagnosis not present

## 2017-12-31 DIAGNOSIS — Y92410 Unspecified street and highway as the place of occurrence of the external cause: Secondary | ICD-10-CM | POA: Insufficient documentation

## 2017-12-31 DIAGNOSIS — Y999 Unspecified external cause status: Secondary | ICD-10-CM | POA: Insufficient documentation

## 2017-12-31 DIAGNOSIS — S161XXA Strain of muscle, fascia and tendon at neck level, initial encounter: Secondary | ICD-10-CM | POA: Diagnosis not present

## 2017-12-31 DIAGNOSIS — S62317A Displaced fracture of base of fifth metacarpal bone. left hand, initial encounter for closed fracture: Secondary | ICD-10-CM | POA: Insufficient documentation

## 2017-12-31 DIAGNOSIS — S6992XA Unspecified injury of left wrist, hand and finger(s), initial encounter: Secondary | ICD-10-CM | POA: Diagnosis present

## 2017-12-31 DIAGNOSIS — I10 Essential (primary) hypertension: Secondary | ICD-10-CM | POA: Insufficient documentation

## 2017-12-31 DIAGNOSIS — S20319A Abrasion of unspecified front wall of thorax, initial encounter: Secondary | ICD-10-CM | POA: Insufficient documentation

## 2017-12-31 DIAGNOSIS — S20312A Abrasion of left front wall of thorax, initial encounter: Secondary | ICD-10-CM

## 2017-12-31 DIAGNOSIS — Z79899 Other long term (current) drug therapy: Secondary | ICD-10-CM | POA: Diagnosis not present

## 2017-12-31 MED ORDER — CYCLOBENZAPRINE HCL 10 MG PO TABS: 10.0000 mg | ORAL_TABLET | Freq: Three times a day (TID) | ORAL | 0 refills | Status: DC | PRN

## 2017-12-31 MED ORDER — NAPROXEN 500 MG PO TABS: 500.0000 mg | ORAL_TABLET | Freq: Two times a day (BID) | ORAL | 0 refills | Status: DC
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
## 2017-12-31 NOTE — Discharge Instructions (Signed)
Follow up with orthopedics. Return to the ER for symptoms that change or worsen if unable to schedule an appointment with primary care or orthopedics.

## 2018-07-17 ENCOUNTER — Other Ambulatory Visit: Payer: Self-pay

## 2018-07-17 ENCOUNTER — Emergency Department
Admission: EM | Admit: 2018-07-17 | Discharge: 2018-07-17 | Disposition: A | Discharge status: Home / Self Care | Payer: BLUE CROSS/BLUE SHIELD | Attending: Emergency Medicine | Admitting: Emergency Medicine

## 2018-07-17 ENCOUNTER — Encounter: Payer: Self-pay | Admitting: Emergency Medicine

## 2018-07-17 DIAGNOSIS — F410 Panic disorder [episodic paroxysmal anxiety] without agoraphobia: Secondary | ICD-10-CM | POA: Diagnosis not present

## 2018-07-17 DIAGNOSIS — E119 Type 2 diabetes mellitus without complications: Secondary | ICD-10-CM | POA: Insufficient documentation

## 2018-07-17 DIAGNOSIS — Z79899 Other long term (current) drug therapy: Secondary | ICD-10-CM | POA: Diagnosis not present

## 2018-07-17 DIAGNOSIS — I10 Essential (primary) hypertension: Secondary | ICD-10-CM | POA: Insufficient documentation

## 2018-07-17 DIAGNOSIS — Z880 Allergy status to penicillin: Secondary | ICD-10-CM | POA: Diagnosis not present

## 2018-07-17 DIAGNOSIS — R202 Paresthesia of skin: Secondary | ICD-10-CM

## 2018-07-17 DIAGNOSIS — R200 Anesthesia of skin: Secondary | ICD-10-CM | POA: Diagnosis present

## 2018-07-17 HISTORY — DX: Type 2 diabetes mellitus without complications: E11.9

## 2018-07-17 HISTORY — DX: Anxiety disorder, unspecified: F41.9

## 2018-07-17 LAB — CBC WITH DIFFERENTIAL/PLATELET
ABS IMMATURE GRANULOCYTES: 0.02 10*3/uL (ref 0.00–0.07)
Basophils Absolute: 0 10*3/uL (ref 0.0–0.1)
Basophils Relative: 1 %
Eosinophils Absolute: 0.1 10*3/uL (ref 0.0–0.5)
Eosinophils Relative: 2 %
HCT: 39.1 % (ref 36.0–46.0)
HEMOGLOBIN: 13.7 g/dL (ref 12.0–15.0)
Immature Granulocytes: 0 %
LYMPHS PCT: 27 %
Lymphs Abs: 1.6 10*3/uL (ref 0.7–4.0)
MCH: 32.2 pg (ref 26.0–34.0)
MCHC: 35 g/dL (ref 30.0–36.0)
MCV: 91.8 fL (ref 80.0–100.0)
MONO ABS: 0.4 10*3/uL (ref 0.1–1.0)
Monocytes Relative: 7 %
NEUTROS ABS: 3.7 10*3/uL (ref 1.7–7.7)
Neutrophils Relative %: 63 %
Platelets: 185 10*3/uL (ref 150–400)
RBC: 4.26 MIL/uL (ref 3.87–5.11)
RDW: 12.2 % (ref 11.5–15.5)
WBC: 5.8 10*3/uL (ref 4.0–10.5)
nRBC: 0 % (ref 0.0–0.2)

## 2018-07-17 LAB — COMPREHENSIVE METABOLIC PANEL
ALBUMIN: 4.3 g/dL (ref 3.5–5.0)
ALT: 70 U/L — AB (ref 0–44)
AST: 45 U/L — AB (ref 15–41)
Alkaline Phosphatase: 47 U/L (ref 38–126)
Anion gap: 10 (ref 5–15)
BUN: 9 mg/dL (ref 6–20)
CHLORIDE: 104 mmol/L (ref 98–111)
CO2: 24 mmol/L (ref 22–32)
CREATININE: 0.61 mg/dL (ref 0.44–1.00)
Calcium: 8.5 mg/dL — ABNORMAL LOW (ref 8.9–10.3)
GFR calc Af Amer: >60 mL/min (ref >60)
GFR calc non Af Amer: >60 mL/min (ref >60)
GLUCOSE: 139 mg/dL — AB (ref 70–99)
Potassium: 4.1 mmol/L (ref 3.5–5.1)
Sodium: 138 mmol/L (ref 135–145)
Total Bilirubin: 0.5 mg/dL (ref 0.3–1.2)
Total Protein: 7.1 g/dL (ref 6.5–8.1)

## 2018-07-17 LAB — MAGNESIUM: Magnesium: 1.5 mg/dL — ABNORMAL LOW (ref 1.7–2.4)

## 2018-07-17 LAB — TROPONIN I

## 2018-07-17 LAB — TSH: TSH: 1.262 u[IU]/mL (ref 0.350–4.500)

## 2018-07-17 LAB — T4, FREE: Free T4: 0.72 ng/dL — ABNORMAL LOW (ref 0.82–1.77)

## 2018-07-17 MED ORDER — DILTIAZEM HCL ER COATED BEADS 120 MG PO CP24
120.0000 mg | ORAL_CAPSULE | ORAL | Status: AC
  Administered 2018-07-17: 120 mg via ORAL
  Filled 2018-07-17: qty 1

## 2018-07-17 MED ORDER — MAGNESIUM SULFATE 2 GM/50ML IV SOLN
2.0000 g | Freq: Once | INTRAVENOUS | Status: AC
  Administered 2018-07-17: 2 g via INTRAVENOUS
  Filled 2018-07-17: qty 50

## 2018-07-17 NOTE — ED Triage Notes (Signed)
Pt arrives via [facility] with c/o panic attack which started around 0430. Pt states that her BP was 191/115 which EMS reports was similar to their reading. Pt is in NAD.

## 2018-07-17 NOTE — ED Provider Notes (Signed)
[facility] Emergency Department Provider Note  ____________________________________________   First MD Initiated Contact with Patient 07/17/18 [phone]     (approximate)  I have reviewed the triage vital signs and the nursing notes.   HISTORY  Chief Complaint Panic Attack    HPI Abigail Mcneil is a 48 y.o. female with medical history as listed below whose medical history includes Graves' disease and hypertension and she sees doctors primarily at [facility] but also a holistic medicine doctor in [place] for her Luiz Blare' disease.  She presents by EMS for evaluation of hypertension and bilateral numbness in her arms and hands as well as "not feeling right" all over.  She reports that the symptoms started a few hours ago when she was try to go to sleep and she became increasingly anxious.  Her blood pressure at home was about 191/115 and she was very concerned about that and afraid she might be having a stroke.  Nothing particular made the symptoms better or worse.  She reports that she feels better at this time but still feels a little bit of tingling in her arms and her hands.  She is calm and cooperative currently.  She denies any recent COVID-19 exposures and has had no viral or respiratory symptoms.  She denies fever/chills, chest pain, nausea, vomiting, and abdominal pain.  She has had some numbness and tingling in her arms, some nausea, and a generalized headache that is mild.  She reports being compliant with her medication regimen including diltiazem 24-hour extended release 120 mg in the morning and valsartan 160 mg in the evening.  She also takes naltrexone 3 mg daily prescribed by her holistic medicine doctor in [place].  She no longer takes methimazole or propranolol for the Graves' disease.         Past Medical History:  Diagnosis Date  . Anxiety   . Diabetes mellitus without complication (HCC)   . GERD (gastroesophageal reflux disease)   . Graves disease   .  Hypertension     There are no active problems to display for this patient.   Past Surgical History:  Procedure Laterality Date  . CESAREAN SECTION      Prior to Admission medications   Medication Sig Start Date End Date Taking? Authorizing Provider  chlorthalidone (HYGROTON) 25 MG tablet Take 25 mg by mouth daily.    [provider]  cyclobenzaprine (FLEXERIL) 10 MG tablet Take 1 tablet (10 mg total) by mouth 3 (three) times daily as needed for muscle spasms. 12/31/17   Triplett, Cari B, FNP  escitalopram (LEXAPRO) 10 MG tablet Take 10 mg by mouth daily.    [provider]  fluticasone (FLONASE) 50 MCG/ACT nasal spray Place 2 sprays into both nostrils daily. 05/22/17   Domenick Gong, MD  levofloxacin (LEVAQUIN) 500 MG tablet Take 1 tablet (500 mg total) by mouth daily. X 7 days 05/22/17   Domenick Gong, MD  methimazole (TAPAZOLE) 10 MG tablet Take 10 mg by mouth daily.    [provider]  naproxen (NAPROSYN) 500 MG tablet Take 1 tablet (500 mg total) by mouth 2 (two) times daily with a meal. 12/31/17   Triplett, Cari B, FNP  omeprazole (PRILOSEC) 20 MG capsule Take 20 mg by mouth daily.    [provider]  propranolol (INDERAL) 10 MG tablet Take 5 mg by mouth as needed.     [provider]  sodium chloride (OCEAN) 0.65 % SOLN nasal spray Place 2 sprays into  both nostrils every 2 (two) hours while awake. 09/09/15 10/10/15  Betancourt, Jarold Song, NP    Allergies Amoxicillin; Doxycycline; Penicillins; and Sulfa antibiotics  Family History  Problem Relation Age of Onset  . Diabetes Father   . Hypertension Father     Social History Social History   Tobacco Use  . Smoking status: Never Smoker  . Smokeless tobacco: Never Used  Substance Use Topics  . Alcohol use: Yes  . Drug use: No    Review of Systems Constitutional: No fever/chills Eyes: No visual changes. ENT: No sore throat. Cardiovascular: Denies chest pain. Respiratory: She  had some mild shortness of breath associated with the possible panic attack. Gastrointestinal: No abdominal pain.  Nausea, no vomiting.  No diarrhea.  No constipation. Genitourinary: Negative for dysuria. Musculoskeletal: Negative for neck pain.  Negative for back pain. Integumentary: Negative for rash. Neurological: Mild headache.  Bilateral arm and hand tingling, improved from prior but still mildly present.  No focal weakness.   ____________________________________________   PHYSICAL EXAM:  VITAL SIGNS: ED Triage Vitals  Enc Vitals Group     BP 07/17/18 0545 (!) 185/102     Pulse Rate 07/17/18 0545 81     Resp 07/17/18 0545 18     Temp 07/17/18 0545 98.1 F (36.7 C)     Temp Source 07/17/18 0545 Oral     SpO2 07/17/18 0545 100 %     Weight 07/17/18 0544 73 kg (161 lb)     Height 07/17/18 0544 1.549 m ( )     Head Circumference --      Peak Flow --      Pain Score 07/17/18 0544 0     Pain Loc --      Pain Edu? --      Excl. in GC? --     Constitutional: Alert and oriented. Well appearing and in no acute distress. Eyes: Conjunctivae are normal.  Head: Atraumatic. Nose: No congestion/rhinnorhea. Mouth/Throat: Mucous membranes are moist. Neck: No stridor.  No meningeal signs.   Cardiovascular: Normal rate, regular rhythm. Good peripheral circulation. Grossly normal heart sounds. Respiratory: Normal respiratory effort.  No retractions. Lungs CTAB. Gastrointestinal: Soft and nontender. No distention.  Musculoskeletal: No lower extremity tenderness nor edema. No gross deformities of extremities. Neurologic:  Normal speech and language. No gross focal neurologic deficits are appreciated.  Skin:  Skin is warm, dry and intact. No rash noted. Psychiatric: Mood and affect are normal. Speech and behavior are normal.  ____________________________________________   LABS (all labs ordered are listed, but only abnormal results are displayed)  Labs Reviewed  CBC WITH  DIFFERENTIAL/PLATELET  TROPONIN I  COMPREHENSIVE METABOLIC PANEL  MAGNESIUM  TSH  T4, FREE   ____________________________________________  EKG  No indication for EKG ____________________________________________  RADIOLOGY   ED MD interpretation: No indication for imaging  Official radiology report(s): No results found.  ____________________________________________   PROCEDURES   Procedure(s) performed (including Critical Care):  Procedures   ____________________________________________   INITIAL IMPRESSION / MDM / ASSESSMENT AND PLAN / ED COURSE  As part of my medical decision making, I reviewed the following data within the electronic MEDICAL RECORD NUMBER Nursing notes reviewed and incorporated, Labs reviewed , Old chart reviewed and Notes from prior ED visits  Abigail Mcneil was evaluated in Emergency Department on 07/17/2018 for the symptoms described in the history of present illness. She was evaluated in the context of the global COVID-19 pandemic, which necessitated consideration that the patient might be  at risk for infection with the SARS-CoV-2 virus that causes COVID-19. Institutional protocols and algorithms that pertain to the evaluation of patients at risk for COVID-19 are in a state of rapid change based on information released by regulatory bodies including the CDC and federal and state organizations. These policies and algorithms were followed during the patient's care in the ED.      Differential diagnosis includes, but is not limited to, panic attack/anxiety, hyperthyroidism, medication, metabolic abnormality, acute infection.  The patient is well-appearing and in no distress.  Her vital signs are stable except for persistent hypertension and I looked back through her medical record including her cardiology visits which indicate that her blood pressure is consistent with prior blood pressure measurements.  I think that her blood pressure was likely  elevated from a panic attack and she has not had any recent infectious symptoms.  She has not had any contact with COVID-19 patients and should be at low risk.  There is no indication for EKG as she is not having any chest pain and she is not having any tachycardia.  However given her history of thyroid disease, I think it is reasonable to check blood Mcneil including free T4 and TSH.  If this is all appropriate she should be able to be discharged and follow-up as an outpatient.  She understands and agrees with the plan.  I am giving her the morning dose of diltiazem extended release 120 mg by mouth.     ____________________________________________  FINAL CLINICAL IMPRESSION(S) / ED DIAGNOSES  Final diagnoses:  Essential hypertension     MEDICATIONS GIVEN DURING THIS VISIT:  Medications  diltiazem (CARDIZEM CD) 24 hr capsule 120 mg (120 mg Oral Given 07/17/18 0719)     ED Discharge Orders    None       Note:  This document was prepared using Dragon voice recognition software and may include unintentional dictation errors.   Loleta Rose, MD 07/17/18 [phone]

## 2018-07-17 NOTE — ED Notes (Signed)
Pt observed in room , reports tingling to bilateral arms and hands , denies pain at this time . NAD observed .

## 2018-07-17 NOTE — Discharge Instructions (Addendum)
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
## 2019-10-21 ENCOUNTER — Ambulatory Visit
Admission: EM | Admit: 2019-10-21 | Discharge: 2019-10-21 | Disposition: A | Discharge status: Home / Self Care | Payer: BC Managed Care – PPO

## 2019-10-21 ENCOUNTER — Other Ambulatory Visit: Payer: Self-pay

## 2019-10-21 ENCOUNTER — Encounter: Payer: Self-pay | Admitting: Emergency Medicine

## 2019-10-21 DIAGNOSIS — S70362A Insect bite (nonvenomous), left thigh, initial encounter: Secondary | ICD-10-CM | POA: Diagnosis not present

## 2019-10-21 DIAGNOSIS — W57XXXA Bitten or stung by nonvenomous insect and other nonvenomous arthropods, initial encounter: Secondary | ICD-10-CM | POA: Diagnosis not present

## 2019-10-21 MED ORDER — MUPIROCIN 2 % EX OINT: 1.0000 "application " | TOPICAL_OINTMENT | Freq: Three times a day (TID) | CUTANEOUS | 0 refills | Status: DC

## 2019-10-21 NOTE — ED Provider Notes (Signed)
[facility]    CSN: [id number] Arrival date & time: 10/21/19  3329      History   Chief Complaint Chief Complaint  Patient presents with  . Insect Bite    HPI Abigail Mcneil is a 49 y.o. female.   HPI  49 year old diabetic female states that she noticed an insect bite on her left upper medial thigh during the night on Monday 5 days prior to this visit.  Complaint of redness and swelling and discomfort at the site.  She does not remember of any specific insect and may have bitten her.  They do have animals in the house.  She had no fever or chills.  Is allergic to numerous antibiotics.       Past Medical History:  Diagnosis Date  . Anxiety   . Diabetes mellitus without complication (HCC)   . GERD (gastroesophageal reflux disease)   . Graves disease   . Hypertension     There are no problems to display for this patient.   Past Surgical History:  Procedure Laterality Date  . CESAREAN SECTION      OB History   No obstetric history on file.      Home Medications    Prior to Admission medications   Medication Sig Start Date End Date Taking? Authorizing Provider  metFORMIN (GLUCOPHAGE) 500 MG tablet Take by mouth. 07/18/18  Yes [provider]  fluticasone (FLONASE) 50 MCG/ACT nasal spray Place 2 sprays into both nostrils daily. 05/22/17   Domenick Gong, MD  methimazole (TAPAZOLE) 10 MG tablet Take 10 mg by mouth daily.    [provider]  Multiple Vitamin (MULTI-VITAMIN) tablet Take 1 tablet by mouth daily.    [provider]  mupirocin ointment (BACTROBAN) 2 % Apply 1 application topically 3 (three) times daily. 10/21/19   Lutricia Feil, PA-C  naproxen (NAPROSYN) 500 MG tablet Take 1 tablet (500 mg total) by mouth 2 (two) times daily with a meal. 12/31/17   Triplett, Cari B, FNP  propranolol (INDERAL) 10 MG tablet Take 5 mg by mouth as needed.     [provider]  sodium chloride (OCEAN) 0.65 % SOLN nasal  spray Place 2 sprays into both nostrils every 2 (two) hours while awake. 09/09/15 10/10/15  Betancourt, Jarold Song, NP  valsartan-hydrochlorothiazide (DIOVAN-HCT) 160-12.5 MG tablet Take 1 tablet by mouth daily. 09/05/19   [provider]  chlorthalidone (HYGROTON) 25 MG tablet Take 25 mg by mouth daily.  10/21/19  [provider]  escitalopram (LEXAPRO) 10 MG tablet Take 10 mg by mouth daily.  10/21/19  [provider]  omeprazole (PRILOSEC) 20 MG capsule Take 20 mg by mouth daily.  10/21/19  [provider]    Family History Family History  Problem Relation Age of Onset  . Diabetes Father   . Hypertension Father     Social History Social History   Tobacco Use  . Smoking status: Never Smoker  . Smokeless tobacco: Never Used  Vaping Use  . Vaping Use: Never used  Substance Use Topics  . Alcohol use: Yes  . Drug use: No     Allergies   Amoxicillin, Doxycycline, Penicillins, and Sulfa antibiotics   Review of Systems Review of Systems  Constitutional: Negative for activity change, appetite change, chills, diaphoresis, fatigue and fever.  Skin: Positive for color change and wound.  All other systems reviewed and are negative.    Physical Exam Triage Vital Signs ED Triage Vitals  Enc  Vitals Group     BP 10/21/19 0836 (!) 134/93     Pulse Rate 10/21/19 0836 61     Resp 10/21/19 0836 14     Temp 10/21/19 0836 98.1 F (36.7 C)     Temp Source 10/21/19 0836 Oral     SpO2 10/21/19 0836 99 %     Weight 10/21/19 0832 170 lb (77.1 kg)     Height 10/21/19 0832 5\' 1"  (1.549 m)     Head Circumference --      Peak Flow --      Pain Score 10/21/19 0832 2     Pain Loc --      Pain Edu? --      Excl. in GC? --    No data found.  Updated Vital Signs BP (!) 134/93 (BP Location: Right Arm)   Pulse 61   Temp 98.1 F (36.7 C) (Oral)   Resp 14   Ht 5\' 1"  (1.549 m)   Wt 170 lb (77.1 kg)   LMP 10/19/2019 (Exact Date)   SpO2 99%   BMI 32.12 kg/m    Visual Acuity Right Eye Distance:   Left Eye Distance:   Bilateral Distance:    Right Eye Near:   Left Eye Near:    Bilateral Near:     Physical Exam Vitals and nursing note reviewed.  Constitutional:      General: She is not in acute distress.    Appearance: Normal appearance. She is obese. She is not ill-appearing or toxic-appearing.  HENT:     Head: Normocephalic and atraumatic.  Eyes:     Conjunctiva/sclera: Conjunctivae normal.  Musculoskeletal:        General: Normal range of motion.     Cervical back: Normal range of motion and neck supple.  Skin:    General: Skin is warm and dry.     Findings: Rash present.     Comments: Examination of the left upper medial thigh shows an area of redness that is blanchable measuring approximately 2.5 cm in diameter.  There is a central area pustular appearing of 1 mm in diameter.  There is no fluctuance but there is induration circumferential around this pustular area.  There is no drainage.  No other lesions seen.  Neurological:     General: No focal deficit present.     Mental Status: She is alert and oriented to person, place, and time.  Psychiatric:        Mood and Affect: Mood normal.        Behavior: Behavior normal.        Thought Content: Thought content normal.        Judgment: Judgment normal.      UC Treatments / Results  Labs (all labs ordered are listed, but only abnormal results are displayed) Labs Reviewed - No data to display  EKG   Radiology No results found.  Procedures Procedures (including critical care time)  Medications Ordered in UC Medications - No data to display  Initial Impression / Assessment and Plan / UC Course  I have reviewed the triage vital signs and the nursing notes.  Pertinent labs & imaging results that were available during my care of the patient were reviewed by me and considered in my medical decision making (see chart for details).   49 year old female diabetic presents  with 5-day history of a possible insect bite to the upper medial left thigh.  There is induration but no fluctuance and no  drainage.  No apparent puncture wound present but a pustule in the center of the erythematous area.  I will start her on a warm compress application followed by mupirocin ointment.  We had a discussion regarding her antibiotic allergies and have decided that we will hold off on oral antibiotics unless she is not improving with the warm compresses and application of mupirocin ointment.  She is agreeable to this program.  If she notices  any changes including fever chills or extension of the lesion she will return to our clinic for consideration of placing her on clindamycin.   Final Clinical Impressions(s) / UC Diagnoses   Final diagnoses:  Insect bite of left thigh, initial encounter     Discharge Instructions     Wash the area daily.  Afterwards apply warm compresses as we discussed at least 3 times daily for 5 to 10 minutes each time.  Dry thoroughly and apply the mupirocin ointment to all areas of redness.  If it seems to be worsening return to our clinic for oral antibiotics.  We decided against oral antibiotics at this point because of your allergies.    ED Prescriptions    Medication Sig Dispense Auth. Provider   mupirocin ointment (BACTROBAN) 2 % Apply 1 application topically 3 (three) times daily. 22 g Lutricia Feil, PA-C     PDMP not reviewed this encounter.   Lutricia Feil, PA-C 10/21/19 [phone]

## 2019-10-21 NOTE — ED Triage Notes (Addendum)
Patient states that she noticed an insect bite on her left thigh during the night on Monday.  Patient c/o redness, swelling and discomfort at the site.  Patient denies fevers.

## 2019-10-21 NOTE — Discharge Instructions (Addendum)
Wash the area daily.  Afterwards apply warm compresses as we discussed at least 3 times daily for 5 to 10 minutes each time.  Dry thoroughly and apply the mupirocin ointment to all areas of redness.  If it seems to be worsening return to our clinic for oral antibiotics.  We decided against oral antibiotics at this point because of your allergies.

## 2021-11-24 ENCOUNTER — Ambulatory Visit
Admission: EM | Admit: 2021-11-24 | Discharge: 2021-11-24 | Disposition: A | Discharge status: Home / Self Care | Payer: BC Managed Care – PPO | Attending: Emergency Medicine | Admitting: Emergency Medicine

## 2021-11-24 DIAGNOSIS — E871 Hypo-osmolality and hyponatremia: Secondary | ICD-10-CM | POA: Diagnosis not present

## 2021-11-24 DIAGNOSIS — N760 Acute vaginitis: Secondary | ICD-10-CM | POA: Diagnosis not present

## 2021-11-24 DIAGNOSIS — R10819 Abdominal tenderness, unspecified site: Secondary | ICD-10-CM

## 2021-11-24 DIAGNOSIS — B9689 Other specified bacterial agents as the cause of diseases classified elsewhere: Secondary | ICD-10-CM | POA: Insufficient documentation

## 2021-11-24 LAB — CBC WITH DIFFERENTIAL/PLATELET
Abs Immature Granulocytes: 0.03 10*3/uL (ref 0.00–0.07)
Basophils Absolute: 0 10*3/uL (ref 0.0–0.1)
Basophils Relative: 1 %
Eosinophils Absolute: 0.3 10*3/uL (ref 0.0–0.5)
Eosinophils Relative: 4 %
HCT: 43 % (ref 36.0–46.0)
Hemoglobin: 14.9 g/dL (ref 12.0–15.0)
Immature Granulocytes: 0 %
Lymphocytes Relative: 28 %
Lymphs Abs: 2.3 10*3/uL (ref 0.7–4.0)
MCH: 31.2 pg (ref 26.0–34.0)
MCHC: 34.7 g/dL (ref 30.0–36.0)
MCV: 90.1 fL (ref 80.0–100.0)
Monocytes Absolute: 0.6 10*3/uL (ref 0.1–1.0)
Monocytes Relative: 7 %
Neutro Abs: 4.8 10*3/uL (ref 1.7–7.7)
Neutrophils Relative %: 60 %
Platelets: 210 10*3/uL (ref 150–400)
RBC: 4.77 MIL/uL (ref 3.87–5.11)
RDW: 13.1 % (ref 11.5–15.5)
WBC: 8 10*3/uL (ref 4.0–10.5)
nRBC: 0 % (ref 0.0–0.2)

## 2021-11-24 LAB — COMPREHENSIVE METABOLIC PANEL
ALT: 43 U/L (ref 0–44)
AST: 28 U/L (ref 15–41)
Albumin: 4.4 g/dL (ref 3.5–5.0)
Alkaline Phosphatase: 65 U/L (ref 38–126)
Anion gap: 7 (ref 5–15)
BUN: 16 mg/dL (ref 6–20)
CO2: 24 mmol/L (ref 22–32)
Calcium: 8.8 mg/dL — ABNORMAL LOW (ref 8.9–10.3)
Chloride: 103 mmol/L (ref 98–111)
Creatinine, Ser: 0.65 mg/dL (ref 0.44–1.00)
GFR, Estimated: >60 mL/min (ref >60)
Glucose, Bld: 106 mg/dL — ABNORMAL HIGH (ref 70–99)
Potassium: 3.9 mmol/L (ref 3.5–5.1)
Sodium: 134 mmol/L — ABNORMAL LOW (ref 135–145)
Total Bilirubin: 0.6 mg/dL (ref 0.3–1.2)
Total Protein: 7.5 g/dL (ref 6.5–8.1)

## 2021-11-24 LAB — URINALYSIS, ROUTINE W REFLEX MICROSCOPIC
Bilirubin Urine: NEGATIVE
Glucose, UA: NEGATIVE mg/dL
Hgb urine dipstick: NEGATIVE
Ketones, ur: 15 mg/dL — AB
Nitrite: NEGATIVE
Protein, ur: NEGATIVE mg/dL
Specific Gravity, Urine: 1.025 (ref 1.005–1.030)
pH: 6 (ref 5.0–8.0)

## 2021-11-24 LAB — WET PREP, GENITAL
Sperm: NONE SEEN
Trich, Wet Prep: NONE SEEN
WBC, Wet Prep HPF POC: >10 — AB (ref <10)
Yeast Wet Prep HPF POC: NONE SEEN

## 2021-11-24 LAB — URINALYSIS, MICROSCOPIC (REFLEX)

## 2021-11-24 LAB — LIPASE, BLOOD: Lipase: 29 U/L (ref 11–51)

## 2021-11-24 MED ORDER — METRONIDAZOLE 500 MG PO TABS: 500.0000 mg | ORAL_TABLET | Freq: Two times a day (BID) | ORAL | 0 refills | Status: DC

## 2021-11-24 MED ORDER — ONDANSETRON 8 MG PO TBDP: 8.0000 mg | ORAL_TABLET | Freq: Three times a day (TID) | ORAL | 0 refills | Status: AC | PRN

## 2021-11-24 NOTE — ED Triage Notes (Signed)
Patient to Urgent Care with complaints of generalized abdominal pain that started this morning. Describes it as cramping, "like pregnancy contractions". Denies NVD. Denies urinary symptoms.   Last BM this AM.

## 2021-11-24 NOTE — ED Provider Notes (Signed)
[facility]    CSN: [id number] Arrival date & time: 11/24/21  1417      History   Chief Complaint Chief Complaint  Patient presents with   Abdominal Cramping    HPI Abigail Mcneil is a 51 y.o. female.   HPI  51 year old female here for evaluation of abdominal pain.  Patient reports that she has been experiencing crampy abdominal pain and feel like her guts are on fire with associated nausea since this morning.  No fever, vomiting or diarrhea, pain with urination or urinary urgency or frequency, blood in her urine, constipation, or blood in her stool.  She reports that she had a normal bowel movement this morning.  She does endorse some mild vaginal itching.  She has a history of diastases recti and she is concerned she may have a hernia.  She also has a history of GERD but states that she is never had reflux like this in the past.  She has taken Pepto-Bismol and turmeric Gummies without any improvement of symptoms.  Additionally, she is on Ozempic but has been on that for the last 8 weeks.  Past Medical History:  Diagnosis Date   Anxiety    Diabetes mellitus without complication (HCC)    GERD (gastroesophageal reflux disease)    Graves disease    Hypertension     There are no problems to display for this patient.   Past Surgical History:  Procedure Laterality Date   CESAREAN SECTION      OB History   No obstetric history on file.      Home Medications    Prior to Admission medications   Medication Sig Start Date End Date Taking? Authorizing Provider  metroNIDAZOLE (FLAGYL) 500 MG tablet Take 1 tablet (500 mg total) by mouth 2 (two) times daily. 11/24/21  Yes Becky Augusta, NP  ondansetron (ZOFRAN-ODT) 8 MG disintegrating tablet Take 1 tablet (8 mg total) by mouth every 8 (eight) hours as needed for nausea or vomiting. 11/24/21  Yes Becky Augusta, NP  Multiple Vitamin (MULTI-VITAMIN) tablet Take 1 tablet by mouth daily.    [provider]   naltrexone (DEPADE) 50 MG tablet Take 5 mg by mouth.    [provider]  valsartan-hydrochlorothiazide (DIOVAN-HCT) 160-12.5 MG tablet Take 1 tablet by mouth daily. 09/05/19   [provider]  chlorthalidone (HYGROTON) 25 MG tablet Take 25 mg by mouth daily.  10/21/19  [provider]  escitalopram (LEXAPRO) 10 MG tablet Take 10 mg by mouth daily.  10/21/19  [provider]  omeprazole (PRILOSEC) 20 MG capsule Take 20 mg by mouth daily.  10/21/19  [provider]    Family History Family History  Problem Relation Age of Onset   Diabetes Father    Hypertension Father     Social History Social History   Tobacco Use   Smoking status: Never   Smokeless tobacco: Never  Vaping Use   Vaping Use: Never used  Substance Use Topics   Alcohol use: Yes   Drug use: No     Allergies   Amoxicillin, Doxycycline, Penicillins, and Sulfa antibiotics   Review of Systems Review of Systems  Constitutional:  Negative for fever.  Gastrointestinal:  Positive for abdominal pain and nausea. Negative for blood in stool, constipation, diarrhea and vomiting.  Genitourinary:  Positive for vaginal pain. Negative for dysuria, frequency, hematuria, urgency and vaginal discharge.       Vaginal itching  Skin:  Negative for rash.  Hematological:  Negative.   Psychiatric/Behavioral: Negative.       Physical Exam Triage Vital Signs ED Triage Vitals  Enc Vitals Group     BP      Pulse      Resp      Temp      Temp src      SpO2      Weight      Height      Head Circumference      Peak Flow      Pain Score      Pain Loc      Pain Edu?      Excl. in GC?    No data found.  Updated Vital Signs BP (!) 166/92   Pulse 70   Temp 97.9 F (36.6 C)   Resp 18   Ht 5' (1.524 m)   Wt 180 lb (81.6 kg)   LMP 11/04/2021   SpO2 99%   BMI 35.15 kg/m   Visual Acuity Right Eye Distance:   Left Eye Distance:   Bilateral Distance:    Right Eye Near:   Left  Eye Near:    Bilateral Near:     Physical Exam Vitals and nursing note reviewed.  Constitutional:      Appearance: Normal appearance. She is not ill-appearing.  HENT:     Head: Normocephalic and atraumatic.  Cardiovascular:     Rate and Rhythm: Normal rate and regular rhythm.     Pulses: Normal pulses.     Heart sounds: Normal heart sounds. No murmur heard.    No friction rub. No gallop.  Pulmonary:     Effort: Pulmonary effort is normal.     Breath sounds: Normal breath sounds. No wheezing, rhonchi or rales.  Abdominal:     General: Bowel sounds are normal.     Palpations: Abdomen is soft.     Tenderness: There is abdominal tenderness. There is no guarding or rebound.     Hernia: No hernia is present.  Skin:    General: Skin is warm and dry.     Capillary Refill: Capillary refill takes less than 2 seconds.     Findings: No erythema or rash.  Neurological:     General: No focal deficit present.     Mental Status: She is alert and oriented to person, place, and time.  Psychiatric:        Mood and Affect: Mood normal.        Behavior: Behavior normal.        Thought Content: Thought content normal.        Judgment: Judgment normal.      UC Treatments / Results  Labs (all labs ordered are listed, but only abnormal results are displayed) Labs Reviewed  WET PREP, GENITAL - Abnormal; Notable for the following components:      Result Value   Clue Cells Wet Prep HPF POC PRESENT (*)    WBC, Wet Prep HPF POC >10 (*)    All other components within normal limits  COMPREHENSIVE METABOLIC PANEL - Abnormal; Notable for the following components:   Sodium 134 (*)    Glucose, Bld 106 (*)    Calcium 8.8 (*)    All other components within normal limits  URINALYSIS, ROUTINE W REFLEX MICROSCOPIC - Abnormal; Notable for the following components:   APPearance HAZY (*)    Ketones, ur 15 (*)    Leukocytes,Ua SMALL (*)    All other components within normal limits  URINALYSIS,  MICROSCOPIC (REFLEX) - Abnormal; Notable for the following components:   Bacteria, UA MANY (*)    All other components within normal limits  CBC WITH DIFFERENTIAL/PLATELET  LIPASE, BLOOD    EKG   Radiology No results found.  Procedures Procedures (including critical care time)  Medications Ordered in UC Medications - No data to display  Initial Impression / Assessment and Plan / UC Course  I have reviewed the triage vital signs and the nursing notes.  Pertinent labs & imaging results that were available during my care of the patient were reviewed by me and considered in my medical decision making (see chart for details).  Patient is a very pleasant, nontoxic-appearing 51 year old female here for evaluation of crampy generalized abdominal pain that is associate with burning and nausea that started this morning.  The patient also endorses some vaginal itching but denies any discharge.  She denies urinary symptoms, vomiting, or diarrhea.  She has had some nausea.  On exam patient is in no acute distress.  Cardiopulmonary exam reveals S1-S2 heart sounds with regular rate and rhythm and lung sounds are clear to auscultation in all fields.  Abdomen is soft with generalized upper abdominal and left lower quadrant tenderness.  No guarding or rebound.  No suprapubic tenderness or right lower quadrant tenderness elicited on exam.  No masses or hernias appreciated on exam.  I will order a CBC, CMP, lipase, UA, and vaginal wet prep to look for the presence of an infection or GI inflammation.  Differentials include GERD, cholecystitis, pancreatitis, colitis, bacterial vaginosis, vaginal yeast infection, or medication effect.  CBC is unremarkable.  CMP shows mild hyponatremia with a sodium of 134, and mild hypocalcemia with a calcium of 8.8.  Remainder of electrolytes, renal function, and transaminases are unremarkable.  Lipase is normal at 29.  Urinalysis shows hazy appearance with 15 ketones and  small leukocyte esterase.  Negative for nitrite or protein.  Reflex microscopy shows 11-20 squamous epithelial cells, many bacteria, and 6-10 WBCs.  Vaginal wet prep shows the presence of clue cells.  I suspect the patient's symptoms are coming from bacterial vaginosis and I will treat her with metronidazole twice daily for 7 days.  I will give her Zofran she can have on hand as needed for nausea.  If she develops any new or worsening symptoms she should return for reevaluation.   Final Clinical Impressions(s) / UC Diagnoses   Final diagnoses:  BV (bacterial vaginosis)     Discharge Instructions      Take the Flagyl (metronidazole) 500 mg twice daily for treatment of your bacterial vaginosis.  Avoid alcohol while on the metronidazole as taken together will cause of vomiting.  Bacterial vaginosis is often caused by a imbalance of bacteria in your vaginal vault.  This is sometimes a result of using tampons or hormonal fluctuations during her menstrual cycle.  You if your symptoms are recurrent you can try using a boric acid suppository twice weekly to help maintain the acid-base balance in your vagina vault which could prevent further infection.  Use the Zofran every 8 hours as needed for nausea.  You can also try vaginal probiotics to help return normal bacterial balance.      ED Prescriptions     Medication Sig Dispense Auth. Provider   metroNIDAZOLE (FLAGYL) 500 MG tablet Take 1 tablet (500 mg total) by mouth 2 (two) times daily. 14 tablet Becky Augusta, NP   ondansetron (ZOFRAN-ODT) 8 MG disintegrating tablet Take 1 tablet (  8 mg total) by mouth every 8 (eight) hours as needed for nausea or vomiting. 20 tablet Becky Augusta, NP      PDMP not reviewed this encounter.   Becky Augusta, NP 11/24/21 1537

## 2021-11-24 NOTE — Discharge Instructions (Addendum)
Take the Flagyl (metronidazole) 500 mg twice daily for treatment of your bacterial vaginosis.  Avoid alcohol while on the metronidazole as taken together will cause of vomiting.  Bacterial vaginosis is often caused by a imbalance of bacteria in your vaginal vault.  This is sometimes a result of using tampons or hormonal fluctuations during her menstrual cycle.  You if your symptoms are recurrent you can try using a boric acid suppository twice weekly to help maintain the acid-base balance in your vagina vault which could prevent further infection.  Use the Zofran every 8 hours as needed for nausea.  You can also try vaginal probiotics to help return normal bacterial balance.

## 2022-05-28 ENCOUNTER — Encounter: Payer: Self-pay | Admitting: Emergency Medicine

## 2022-05-28 ENCOUNTER — Ambulatory Visit
Admission: EM | Admit: 2022-05-28 | Discharge: 2022-05-28 | Disposition: A | Discharge status: Home / Self Care | Payer: BC Managed Care – PPO | Attending: Emergency Medicine | Admitting: Emergency Medicine

## 2022-05-28 DIAGNOSIS — H6502 Acute serous otitis media, left ear: Secondary | ICD-10-CM | POA: Diagnosis not present

## 2022-05-28 DIAGNOSIS — U071 COVID-19: Secondary | ICD-10-CM | POA: Diagnosis not present

## 2022-05-28 LAB — GROUP A STREP BY PCR: Group A Strep by PCR: NOT DETECTED

## 2022-05-28 LAB — RAPID INFLUENZA A&B ANTIGENS
Influenza A (ARMC): NEGATIVE
Influenza B (ARMC): NEGATIVE

## 2022-05-28 LAB — SARS CORONAVIRUS 2 BY RT PCR: SARS Coronavirus 2 by RT PCR: POSITIVE — AB

## 2022-05-28 MED ORDER — PROMETHAZINE-DM 6.25-15 MG/5ML PO SYRP: 5.0000 mL | ORAL_SOLUTION | Freq: Every evening | ORAL | 0 refills | Status: DC | PRN

## 2022-05-28 MED ORDER — CEFDINIR 300 MG PO CAPS: 300.0000 mg | ORAL_CAPSULE | Freq: Two times a day (BID) | ORAL | 0 refills | Status: AC

## 2022-05-28 MED ORDER — PAXLOVID (300/100) 20 X 150 MG & 10 X 100MG PO TBPK: 3.0000 | ORAL_TABLET | Freq: Two times a day (BID) | ORAL | 0 refills | Status: AC

## 2022-05-28 NOTE — Discharge Instructions (Addendum)
COVID-19 is a virus and should improve with time  Strep testing is negative, flu test is negative  Begin Paxlovid taking every morning and every evening for 5 days, this reduces the amount of virus in the body which helps to minimize your symptoms, does not fully take away illness  Today you are being treated for an infection of the eardrum  Take cefdinir  twice daily for 10 days, you should begin to see improvement after 48 hours of medication use and then it should progressively get better  May hold warm compresses to the ear for additional comfort  Please not attempted any ear cleaning or object or fluid placement into the ear canal to prevent further irritation  You can take Tylenol and/or Ibuprofen as needed for fever reduction and pain relief.   For cough: honey 1/2 to 1 teaspoon (you can dilute the honey in water or another fluid).  You can also use guaifenesin and dextromethorphan for cough. You can use a humidifier for chest congestion and cough.  If you don't have a humidifier, you can sit in the bathroom with the hot shower running.      For sore throat: try warm salt water gargles, cepacol lozenges, throat spray, warm tea or water with lemon/honey, popsicles or ice, or OTC cold relief medicine for throat discomfort.   For congestion: take a daily anti-histamine like Zyrtec, Claritin, and a oral decongestant, such as pseudoephedrine.  You can also use Flonase 1-2 sprays in each nostril daily.   It is important to stay hydrated: drink plenty of fluids (water, gatorade/powerade/pedialyte, juices, or teas) to keep your throat moisturized and help further relieve irritation/discomfort.

## 2022-05-28 NOTE — ED Triage Notes (Signed)
Pt c/o body aches, fever(100-101), left ear pain, cough, nasal congestion, and sore throat. Started about 3 days ago.

## 2022-05-28 NOTE — ED Provider Notes (Addendum)
[facility]    CSN: [id number] Arrival date & time: 05/28/22  0807      History   Chief Complaint Chief Complaint  Patient presents with   Generalized Body Aches   Fever   Otalgia    left    HPI Abigail Mcneil is a 52 y.o. female.   Patient presents for evaluation of fever, chills, body aches, nasal congestion, rhinorrhea, sore throat, cough, intermittent generalized headaches and left-sided ear pain beginning 3 days ago.  Fever peaking at 100.  Cough is productive but denies shortness of breath and wheezing.   Tolerating food and liquids.  Has attempted use of NyQuil.  Known exposure to strep and a viral illness in household.    Past Medical History:  Diagnosis Date   Anxiety    Diabetes mellitus without complication (HCC)    GERD (gastroesophageal reflux disease)    Graves disease    Hypertension     There are no problems to display for this patient.   Past Surgical History:  Procedure Laterality Date   CESAREAN SECTION      OB History   No obstetric history on file.      Home Medications    Prior to Admission medications   Medication Sig Start Date End Date Taking? Authorizing Provider  estradiol (VIVELLE-DOT) 0.025 MG/24HR 1 patch 2 (two) times a week. 05/05/22  Yes [provider]  metFORMIN (GLUCOPHAGE-XR) 500 MG 24 hr tablet Take by mouth. 07/06/17  Yes [provider]  MOUNJARO 2.5 MG/0.5ML Pen Inject into the skin. 05/11/22  Yes [provider]  Multiple Vitamin (MULTI-VITAMIN) tablet Take 1 tablet by mouth daily.   Yes [provider]  naltrexone (DEPADE) 50 MG tablet Take 5 mg by mouth.   Yes [provider]  valsartan-hydrochlorothiazide (DIOVAN-HCT) 160-12.5 MG tablet Take 1 tablet by mouth daily. 09/05/19  Yes [provider]  metroNIDAZOLE (FLAGYL) 500 MG tablet Take 1 tablet (500 mg total) by mouth 2 (two) times daily. 11/24/21   Margarette Canada, NP  ondansetron (ZOFRAN-ODT) 8 MG  disintegrating tablet Take 1 tablet (8 mg total) by mouth every 8 (eight) hours as needed for nausea or vomiting. 11/24/21   Margarette Canada, NP  chlorthalidone (HYGROTON) 25 MG tablet Take 25 mg by mouth daily.  10/21/19  [provider]  escitalopram (LEXAPRO) 10 MG tablet Take 10 mg by mouth daily.  10/21/19  [provider]  omeprazole (PRILOSEC) 20 MG capsule Take 20 mg by mouth daily.  10/21/19  [provider]    Family History Family History  Problem Relation Age of Onset   Diabetes Father    Hypertension Father     Social History Social History   Tobacco Use   Smoking status: Never   Smokeless tobacco: Never  Vaping Use   Vaping Use: Never used  Substance Use Topics   Alcohol use: Yes   Drug use: No     Allergies   Amoxicillin, Doxycycline, Flagyl [metronidazole], Penicillins, and Sulfa antibiotics   Review of Systems Review of Systems  Constitutional:  Positive for chills and fever. Negative for activity change, appetite change, diaphoresis, fatigue and unexpected weight change.  HENT:  Positive for congestion, ear pain, rhinorrhea and sore throat. Negative for dental problem, drooling, ear discharge, facial swelling, hearing loss, mouth sores, nosebleeds, postnasal drip, sinus pressure, sinus pain, sneezing, tinnitus, trouble swallowing and voice change.   Respiratory:  Positive for cough. Negative for apnea, choking, chest tightness,  shortness of breath, wheezing and stridor.   Cardiovascular: Negative.   Gastrointestinal: Negative.   Musculoskeletal:  Positive for myalgias. Negative for arthralgias, back pain, gait problem, joint swelling, neck pain and neck stiffness.  Skin: Negative.   Neurological:  Positive for headaches. Negative for dizziness, tremors, seizures, syncope, facial asymmetry, speech difficulty, weakness, light-headedness and numbness.     Physical Exam Triage Vital Signs ED Triage Vitals  Enc Vitals Group     BP 05/28/22  0830 136/71     Pulse Rate 05/28/22 0830 79     Resp 05/28/22 0830 18     Temp 05/28/22 0830 99 F (37.2 C)     Temp Source 05/28/22 0830 Oral     SpO2 05/28/22 0830 96 %     Weight 05/28/22 0827 179 lb 14.3 oz (81.6 kg)     Height 05/28/22 0827 5' (1.524 m)     Head Circumference --      Peak Flow --      Pain Score 05/28/22 0826 7     Pain Loc --      Pain Edu? --      Excl. in [place]? --    No data found.  Updated Vital Signs BP 136/71 (BP Location: Right Arm)   Pulse 79   Temp 99 F (37.2 C) (Oral)   Resp 18   Ht 5' (1.524 m)   Wt 179 lb 14.3 oz (81.6 kg)   SpO2 96%   BMI 35.13 kg/m   Visual Acuity Right Eye Distance:   Left Eye Distance:   Bilateral Distance:    Right Eye Near:   Left Eye Near:    Bilateral Near:     Physical Exam Constitutional:      Appearance: Normal appearance.  HENT:     Right Ear: Hearing, tympanic membrane, ear canal and external ear normal.     Nose: Congestion and rhinorrhea present.     Mouth/Throat:     Mouth: Mucous membranes are moist.     Pharynx: No posterior oropharyngeal erythema.     Tonsils: No tonsillar exudate. 0 on the right. 0 on the left.  Cardiovascular:     Rate and Rhythm: Normal rate and regular rhythm.     Pulses: Normal pulses.     Heart sounds: Normal heart sounds.  Pulmonary:     Effort: Pulmonary effort is normal.     Breath sounds: Normal breath sounds.  Skin:    General: Skin is warm and dry.  Neurological:     Mental Status: She is alert and oriented to person, place, and time. Mental status is at baseline.  Psychiatric:        Behavior: Behavior normal.      UC Treatments / Results  Labs (all labs ordered are listed, but only abnormal results are displayed) Labs Reviewed  SARS CORONAVIRUS 2 BY RT PCR - Abnormal; Notable for the following components:      Result Value   SARS Coronavirus 2 by RT PCR POSITIVE (*)    All other components within normal limits  GROUP A STREP BY PCR  RAPID  INFLUENZA A&B ANTIGENS    EKG   Radiology No results found.  Procedures Procedures (including critical care time)  Medications Ordered in UC Medications - No data to display  Initial Impression / Assessment and Plan / UC Course  I have reviewed the triage vital signs and the nursing notes.  Pertinent labs & imaging results that were  available during my care of the patient were reviewed by me and considered in my medical decision making (see chart for details).  COVID-19, nonrecurrent acute serous otitis media of left ear    vital signs are stable patient is in no signs of distress nontoxic-appearing, COVID confirmed by PCR, negative for flu, negative for strep, discussed findings with patient as well as quarantine guidelines per CDC, significant erythema is noted to the left tympanic membrane therefore cefdinir prescribed for treatment, additionally prescribed promethazine DM for supportive care, may continue use of over-the-counter medications, may follow-up with urgent care as needed, work note given Final Clinical Impressions(s) / UC Diagnoses   Final diagnoses:  None   Discharge Instructions   None    ED Prescriptions   None    PDMP not reviewed this encounter.   Hans Eden, NP 05/28/22 0927    Hans Eden, NP 05/28/22 [phone]

## 2022-07-29 ENCOUNTER — Emergency Department
Admission: EM | Admit: 2022-07-29 | Discharge: 2022-07-29 | Disposition: A | Discharge status: Home / Self Care | Payer: BC Managed Care – PPO | Attending: Emergency Medicine | Admitting: Emergency Medicine

## 2022-07-29 ENCOUNTER — Encounter: Payer: Self-pay | Admitting: Intensive Care

## 2022-07-29 ENCOUNTER — Other Ambulatory Visit: Payer: Self-pay

## 2022-07-29 ENCOUNTER — Emergency Department: Payer: BC Managed Care – PPO

## 2022-07-29 DIAGNOSIS — I471 Supraventricular tachycardia, unspecified: Secondary | ICD-10-CM | POA: Diagnosis not present

## 2022-07-29 DIAGNOSIS — R002 Palpitations: Secondary | ICD-10-CM | POA: Diagnosis present

## 2022-07-29 LAB — TSH: TSH: 2.952 u[IU]/mL (ref 0.350–4.500)

## 2022-07-29 LAB — BASIC METABOLIC PANEL
Anion gap: 12 (ref 5–15)
BUN: 15 mg/dL (ref 6–20)
CO2: 23 mmol/L (ref 22–32)
Calcium: 9.1 mg/dL (ref 8.9–10.3)
Chloride: 101 mmol/L (ref 98–111)
Creatinine, Ser: 0.88 mg/dL (ref 0.44–1.00)
GFR, Estimated: >60 mL/min (ref >60)
Glucose, Bld: 199 mg/dL — ABNORMAL HIGH (ref 70–99)
Potassium: 4 mmol/L (ref 3.5–5.1)
Sodium: 136 mmol/L (ref 135–145)

## 2022-07-29 LAB — CBC
HCT: 44.3 % (ref 36.0–46.0)
Hemoglobin: 14.2 g/dL (ref 12.0–15.0)
MCH: 27.7 pg (ref 26.0–34.0)
MCHC: 32.1 g/dL (ref 30.0–36.0)
MCV: 86.5 fL (ref 80.0–100.0)
Platelets: 327 10*3/uL (ref 150–400)
RBC: 5.12 MIL/uL — ABNORMAL HIGH (ref 3.87–5.11)
RDW: 13.7 % (ref 11.5–15.5)
WBC: 9.3 10*3/uL (ref 4.0–10.5)
nRBC: 0 % (ref 0.0–0.2)

## 2022-07-29 LAB — TROPONIN I (HIGH SENSITIVITY)
Troponin I (High Sensitivity): 30 ng/L — ABNORMAL HIGH (ref <18)
Troponin I (High Sensitivity): 81 ng/L — ABNORMAL HIGH (ref <18)

## 2022-07-29 LAB — T4, FREE: Free T4: 0.86 ng/dL (ref 0.61–1.12)

## 2022-07-29 LAB — MAGNESIUM: Magnesium: 1.7 mg/dL (ref 1.7–2.4)

## 2022-07-29 MED ORDER — DILTIAZEM HCL 25 MG/5ML IV SOLN: 10.0000 mg | Freq: Once | INTRAVENOUS | Status: DC

## 2022-07-29 MED ORDER — METOPROLOL TARTRATE 5 MG/5ML IV SOLN
5.0000 mg | Freq: Once | INTRAVENOUS | Status: DC
  Filled 2022-07-29: qty 5

## 2022-07-29 MED ORDER — METOPROLOL TARTRATE 25 MG PO TABS
12.5000 mg | ORAL_TABLET | Freq: Once | ORAL | Status: AC
  Administered 2022-07-29: 12.5 mg via ORAL
  Filled 2022-07-29: qty 1

## 2022-07-29 MED ORDER — ASPIRIN 81 MG PO TBEC: 81.0000 mg | DELAYED_RELEASE_TABLET | Freq: Every day | ORAL | 0 refills | Status: AC

## 2022-07-29 MED ORDER — DILTIAZEM HCL ER COATED BEADS 120 MG PO CP24: 120.0000 mg | ORAL_CAPSULE | Freq: Every day | ORAL | 1 refills | Status: AC

## 2022-07-29 NOTE — ED Notes (Signed)
ED provider at bedside. Xray was at bedside.

## 2022-07-29 NOTE — ED Notes (Signed)
Pt was triaged in room. Introduced self to pt. Pt denies SOB. 20# placed to L AC and labs obtained including blue tube. Pt feels like heart is racing and feels pressure in chest. Alert and oriented.

## 2022-07-29 NOTE — Discharge Instructions (Signed)
For now:  STOP the Valsartan-HCTZ START the Diltiazem XR CALL your Cardiologist for follow-up  START taking a baby aspirin daily

## 2022-07-29 NOTE — ED Notes (Signed)
Pt attempting vagal maneuvers (blowing into syringe). EDP notified.

## 2022-07-29 NOTE — ED Notes (Signed)
Repeat EKG performed. Pt took deep breaths for xray and converted to NSR with HR in 80s while Dr Erma Heritage in room.

## 2022-07-29 NOTE — ED Notes (Signed)
Lab called, trop went up to 81. Dr Erma Heritage informed.

## 2022-07-29 NOTE — ED Notes (Signed)
Hx SVT. Added to med history.

## 2022-07-29 NOTE — ED Triage Notes (Signed)
Patient reports feeling her heart rate this AM that started around 6am today. C/o tightness in chest and shoulders  History high blood pressure

## 2022-07-29 NOTE — ED Provider Notes (Signed)
[facility] Provider Note    Event Date/Time   First MD Initiated Contact with Patient 07/29/22 0720     (approximate)   History   Tachycardia   HPI  Abigail Mcneil is a 52 y.o. female here with chest pain and palpitations.  The patient states that she woke up this morning with acute onset of palpitations.  She folic it was beating very quickly.  She checked her pulse and it was in the 180s.  She has a history of SVT, and subsequently tried vagal maneuvers without significant relief.  She now presents for further evaluation.  She has these episodes only about once a year and does not typically have recurrent issues.  No recent medication changes.  No recent fevers or chills.  No other complaints.     Physical Exam   Triage Vital Signs: ED Triage Vitals  Enc Vitals Group     BP 07/29/22 0711 (!) 132/104     Pulse Rate 07/29/22 0711 (!) 147     Resp 07/29/22 0711 17     Temp 07/29/22 0711 97.6 F (36.4 C)     Temp Source 07/29/22 0711 Oral     SpO2 07/29/22 0711 100 %     Weight 07/29/22 0712 175 lb (79.4 kg)     Height 07/29/22 0712 5' (1.524 m)     Head Circumference --      Peak Flow --      Pain Score 07/29/22 0711 5     Pain Loc --      Pain Edu? --      Excl. in [place]? --     Most recent vital signs: Vitals:   07/29/22 0930 07/29/22 1100  BP: 111/71 121/69  Pulse: 76 69  Resp: 19 15  Temp:    SpO2: 95% 100%     General: Awake, no distress.  CV:  Good peripheral perfusion.  Regular rate and rhythm.  No murmurs. Resp:  Normal work of breathing.  Lungs are clear to auscultation bilaterally. Abd:  No distention.  No tenderness. Other:  No lower extremity edema or swelling.   ED Results / Procedures / Treatments   Labs (all labs ordered are listed, but only abnormal results are displayed) Labs Reviewed  BASIC METABOLIC PANEL - Abnormal; Notable for the following components:      Result Value   Glucose, Bld 199 (*)    All  other components within normal limits  CBC - Abnormal; Notable for the following components:   RBC 5.12 (*)    All other components within normal limits  TROPONIN I (HIGH SENSITIVITY) - Abnormal; Notable for the following components:   Troponin I (High Sensitivity) 30 (*)    All other components within normal limits  TROPONIN I (HIGH SENSITIVITY) - Abnormal; Notable for the following components:   Troponin I (High Sensitivity) 81 (*)    All other components within normal limits  MAGNESIUM  TSH  T4, FREE     EKG Normal sinus rhythm, trickle rate 80.  PR 137, QRS 105, QTc 443.  No acute ST elevations or depressions.  EKG evidence of acute ischemia or infarct.  SVT, ventricular rate 143.  QRS 115, QTc 462.  Nonspecific ST changes likely rate related.    RADIOLOGY Chest x-ray: No active disease   I also independently reviewed and agree with radiologist interpretations.   PROCEDURES:  Critical Care performed: No  .1-3 Lead EKG Interpretation  Performed by:  Shaune Pollack, MD Authorized by: Shaune Pollack, MD     Interpretation: normal     ECG rate:  80-150   ECG rate assessment: tachycardic     Rhythm: sinus tachycardia     Ectopy: none     Conduction: normal   Comments:     Indication: Chest pain      MEDICATIONS ORDERED IN ED: Medications  metoprolol tartrate (LOPRESSOR) tablet 12.5 mg (12.5 mg Oral Given 07/29/22 0747)     IMPRESSION / MDM / ASSESSMENT AND PLAN / ED COURSE  I reviewed the triage vital signs and the nursing notes.                              Differential diagnosis includes, but is not limited to, SVT, A-fib, anemia, ACS, PE, pneumonia, pneumothorax  Patient's presentation is most consistent with acute presentation with potential threat to life or bodily function.  The patient is on the cardiac monitor to evaluate for evidence of arrhythmia and/or significant heart rate changes   Very well-appearing 53 year old female here with  palpitations and transient chest pain, now resolved.  Patient converted spontaneously to normal sinus rhythm on arrival.  Lab work is overall very reassuring.  CBC shows no major leukocytosis or anemia.  BMP shows normal renal function.  Troponin is 30 then slightly elevated at 80, which I suspect is related to the degree of her tachycardia and she has had absolutely no chest pain since resolution of her tachycardia.  She has no known history of ACS.  She had negative stress test within the last 5 years.  Do not suspect ACS.  Chest x-ray is clear.  I had a discussion with Dr. Mariah Milling of cardiology regarding her presentation, EKG, and mild troponin elevation, and he agrees this is all likely related to her tachycardia.  Low concern for ischemic disease at this time.  Will place her on diltiazem XR, start low-dose aspirin, and have her follow-up as an outpatient which I think is very reasonable.  Return precautions given.   FINAL CLINICAL IMPRESSION(S) / ED DIAGNOSES   Final diagnoses:  SVT (supraventricular tachycardia)     Rx / DC Orders   ED Discharge Orders          Ordered    diltiazem (CARDIZEM CD) 120 MG 24 hr capsule  Daily        07/29/22 1118    aspirin EC 81 MG tablet  Daily        07/29/22 1118             Note:  This document was prepared using Dragon voice recognition software and may include unintentional dictation errors.   Shaune Pollack, MD 07/29/22 [phone]

## 2023-05-06 ENCOUNTER — Ambulatory Visit
Admission: EM | Admit: 2023-05-06 | Discharge: 2023-05-06 | Disposition: A | Discharge status: Home / Self Care | Payer: BC Managed Care – PPO | Attending: Family Medicine | Admitting: Family Medicine

## 2023-05-06 DIAGNOSIS — J069 Acute upper respiratory infection, unspecified: Secondary | ICD-10-CM | POA: Diagnosis not present

## 2023-05-06 DIAGNOSIS — Z9189 Other specified personal risk factors, not elsewhere classified: Secondary | ICD-10-CM | POA: Diagnosis not present

## 2023-05-06 DIAGNOSIS — B338 Other specified viral diseases: Secondary | ICD-10-CM | POA: Diagnosis present

## 2023-05-06 DIAGNOSIS — I160 Hypertensive urgency: Secondary | ICD-10-CM

## 2023-05-06 LAB — RESP PANEL BY RT-PCR (RSV, FLU A&B, COVID)  RVPGX2
Influenza A by PCR: NEGATIVE
Influenza B by PCR: NEGATIVE
Resp Syncytial Virus by PCR: POSITIVE — AB
SARS Coronavirus 2 by RT PCR: NEGATIVE

## 2023-05-06 MED ORDER — PROMETHAZINE-DM 6.25-15 MG/5ML PO SYRP: 5.0000 mL | ORAL_SOLUTION | Freq: Every evening | ORAL | 0 refills | Status: AC | PRN

## 2023-05-06 MED ORDER — CLONIDINE HCL 0.1 MG PO TABS
0.1000 mg | ORAL_TABLET | Freq: Once | ORAL | Status: AC
  Administered 2023-05-06: 0.1 mg via ORAL

## 2023-05-06 NOTE — ED Triage Notes (Signed)
Pt c/o cough, congestion, nasal drainage, diarrhea, temperature of 99.7 x5-6days

## 2023-05-06 NOTE — ED Triage Notes (Signed)
Pt states that she has been off her Diltiazem for a couple of months and is going to the pharmacy today to register a new insurance and pick up Metoprolol

## 2023-05-06 NOTE — ED Provider Notes (Addendum)
[facility]    CSN: [id number] Arrival date & time: 05/06/23  0813      History   Chief Complaint Chief Complaint  Patient presents with   Cough   Diarrhea   Nasal Congestion    HPI Abigail Mcneil is a 53 y.o. female.   HPI  History obtained from the patient. Abigail Mcneil presents for chest congestion, nasal congestion, cough, wheezing, diarrhea, fever and body aches.  Sore throat started over the weekend and the symptoms have progressively gotten worse.  Taking prescription cough medicine, Aleve and chest rubs. Took Benadryl plus.  Tmax 99.7 F last night.  Denies vomiting.  Her son is sick too.  She has some shortness of breath when walking the stairs.    She is not taking her blood pressure medications. Took Diltiazem for a while but was supposed to start metoprolol but hasn't picked it up yet.  No chest pain, blurry vision and leg swelling. Endorses headaches.     Past Medical History:  Diagnosis Date   Anxiety    Diabetes mellitus without complication (HCC)    GERD (gastroesophageal reflux disease)    Graves disease    Hypertension    PSVT (paroxysmal supraventricular tachycardia) (HCC) 2004   hx 3 times in past 10 years. 4/24 today is fourth time    There are no active problems to display for this patient.   Past Surgical History:  Procedure Laterality Date   CESAREAN SECTION      OB History   No obstetric history on file.      Home Medications    Prior to Admission medications   Medication Sig Start Date End Date Taking? Authorizing Provider  aspirin EC 81 MG tablet Take 1 tablet (81 mg total) by mouth daily. Swallow whole. 07/29/22 07/29/23 Yes Shaune Pollack, MD  diltiazem (CARDIZEM CD) 120 MG 24 hr capsule Take 1 capsule (120 mg total) by mouth daily. 07/29/22 07/29/23 Yes Shaune Pollack, MD  estradiol (VIVELLE-DOT) 0.025 MG/24HR 1 patch 2 (two) times a week. 05/05/22  Yes [provider]  metFORMIN (GLUCOPHAGE-XR) 500 MG 24 hr  tablet Take by mouth. 07/06/17  Yes [provider]  MOUNJARO 2.5 MG/0.5ML Pen Inject into the skin. 05/11/22  Yes [provider]  Multiple Vitamin (MULTI-VITAMIN) tablet Take 1 tablet by mouth daily.   Yes [provider]  naltrexone (DEPADE) 50 MG tablet Take 5 mg by mouth.   Yes [provider]  ondansetron (ZOFRAN-ODT) 8 MG disintegrating tablet Take 1 tablet (8 mg total) by mouth every 8 (eight) hours as needed for nausea or vomiting. 11/24/21   Becky Augusta, NP  promethazine-dextromethorphan (PROMETHAZINE-DM) 6.25-15 MG/5ML syrup Take 5 mLs by mouth at bedtime as needed for cough. 05/06/23   Katha Cabal, DO  chlorthalidone (HYGROTON) 25 MG tablet Take 25 mg by mouth daily.  10/21/19  [provider]  escitalopram (LEXAPRO) 10 MG tablet Take 10 mg by mouth daily.  10/21/19  [provider]  omeprazole (PRILOSEC) 20 MG capsule Take 20 mg by mouth daily.  10/21/19  [provider]    Family History Family History  Problem Relation Age of Onset   Diabetes Father    Hypertension Father     Social History Social History   Tobacco Use   Smoking status: Former    Types: Cigarettes   Smokeless tobacco: Never  Vaping Use   Vaping status: Never Used  Substance Use Topics   Alcohol use: Yes  Alcohol/week: 21.0 standard drinks of alcohol    Types: 21 Cans of beer per week   Drug use: No     Allergies   Amoxicillin, Doxycycline, Flagyl [metronidazole], Penicillins, and Sulfa antibiotics   Review of Systems Review of Systems: negative unless otherwise stated in HPI.      Physical Exam Triage Vital Signs ED Triage Vitals  Encounter Vitals Group     BP      Systolic BP Percentile      Diastolic BP Percentile      Pulse      Resp      Temp      Temp src      SpO2      Weight      Height      Head Circumference      Peak Flow      Pain Score      Pain Loc      Pain Education      Exclude from Growth Chart     No data found.  Updated Vital Signs BP (!) 170/110 (BP Location: Left Arm)   Pulse 68   Temp 98.6 F (37 C) (Oral)   Ht 5\' 1"  (1.549 m)   LMP 11/04/2021   SpO2 98%   BMI 33.07 kg/m   Visual Acuity Right Eye Distance:   Left Eye Distance:   Bilateral Distance:    Right Eye Near:   Left Eye Near:    Bilateral Near:     Physical Exam GEN:     alert, non-toxic appearing female in no distress    HENT:  mucus membranes moist, clear nasal discharge,  EYES:   no scleral injection or discharge NECK:  normal ROM,  no meningismus   RESP:  no increased work of breathing, clear to auscultation bilaterally CVS:   regular rate and rhythm  Skin:   warm and dry    UC Treatments / Results  Labs (all labs ordered are listed, but only abnormal results are displayed) Labs Reviewed  RESP PANEL BY RT-PCR (RSV, FLU A&B, COVID)  RVPGX2 - Abnormal; Notable for the following components:      Result Value   Resp Syncytial Virus by PCR POSITIVE (*)    All other components within normal limits    EKG   Radiology No results found.  Procedures Procedures (including critical care time)  Medications Ordered in UC Medications  cloNIDine (CATAPRES) tablet 0.1 mg (0.1 mg Oral Given 05/06/23 0918)    Initial Impression / Assessment and Plan / UC Course  I have reviewed the triage vital signs and the nursing notes.  Pertinent labs & imaging results that were available during my care of the patient were reviewed by me and considered in my medical decision making (see chart for details).       Pt is a 53 y.o. female who presents for flulike symptoms. Abigail Mcneil is afebrile here without recent antipyretics. Satting well on room air. Overall pt is non-toxic appearing, well hydrated, without respiratory distress. Pulmonary exam is unremarkable.  COVID, influenza and RSV testing obtained and was negative.  History consistent with viral respiratory illness. Discussed symptomatic treatment.   Explained lack of efficacy of antibiotics in viral disease.  Typical duration of symptoms discussed.  Promethazine DM for cough.  Abigail Mcneil is hypertensive here.  BP 195/109 then after sitting was 182/108.  Considered ED evaluation however, her previous blood pressures from chart review were mostly normal the patient was taking  her blood pressure medications at that time.  EKG obtained showing sinus rhythm without acute ST or T wave changes.  No ischemia seen.  Low voltage has resolved compared to 07/29/2022 EKG.  On chart review, it looks like she was switched from ARB-HCTZ to diltiazem and then from diltiazem to metoprolol.  However, she is currently not taking any blood pressure medication.  She has a cardiology appointment at [facility].  Cardiology note may have been on 05/21/2023..  I suspect high blood pressure issue is secondary to medication nonadherence.  She was given clonidine 0.1 mg here.    She states she has also taken this medication in the past.  Manual BP recheck was 170/110.  Recommended she check herblood pressure for the next 2 weeks at home and call her PCP or cardiologist, if her blood pressure remains elevated.   Return and ED precautions given and voiced understanding. Discussed MDM, treatment plan and plan for follow-up with patient who agrees with plan.     Final Clinical Impressions(s) / UC Diagnoses   Final diagnoses:  Viral URI with cough  Hypertensive urgency  At risk for medication nonadherence  RSV (respiratory syncytial virus infection)     Discharge Instructions      You have RSV/respiratory syncytial virus. See handout for more information.  Your symptoms will gradually improve over the next 7 to 10 days.  You cough can last up to 3 weeks.  Stop by the pharmacy to pick up your cough medication.  Please monitor your blood pressure at home first thing in the morning prior to eating and drinking and journal this for PCP follow-up. Take your Diltiazem when you get home  until you can start your Metoprolol and see your cardiologist.  If you remain persistently elevated after your symptoms improve, you may need medical management. Other recommendations for high blood pressure are to decrease the amount of salt in your diet, exercise (150 minutes of moderate intensity exercise weekly), weight loss.  You should follow up with your primary doctor in 2-3 weeks regarding today's urgent care visit. If your symptoms persist, you may need a additional cardiovascular evaluation.        ED Prescriptions     Medication Sig Dispense Auth. Provider   promethazine-dextromethorphan (PROMETHAZINE-DM) 6.25-15 MG/5ML syrup Take 5 mLs by mouth at bedtime as needed for cough. 118 mL Katha Cabal, DO      PDMP not reviewed this encounter.      Katha Cabal, DO 05/06/23 1206

## 2023-05-06 NOTE — Discharge Instructions (Addendum)
You have RSV/respiratory syncytial virus. See handout for more information.  Your symptoms will gradually improve over the next 7 to 10 days.  You cough can last up to 3 weeks.  Stop by the pharmacy to pick up your cough medication.  Please monitor your blood pressure at home first thing in the morning prior to eating and drinking and journal this for PCP follow-up. Take your Diltiazem when you get home until you can start your Metoprolol and see your cardiologist.  If you remain persistently elevated after your symptoms improve, you may need medical management. Other recommendations for high blood pressure are to decrease the amount of salt in your diet, exercise (150 minutes of moderate intensity exercise weekly), weight loss.  You should follow up with your primary doctor in 2-3 weeks regarding today's urgent care visit. If your symptoms persist, you may need a additional cardiovascular evaluation.
